# Patient Record
Sex: Male | Born: 1935 | Race: White | Hispanic: No | Marital: Married | Smoking: Never smoker
Health system: Southern US, Community
[De-identification: ages and names within clinical notes are randomized; demographics above are authoritative.]

## PROBLEM LIST (undated history)

## (undated) DIAGNOSIS — I714 Abdominal aortic aneurysm, without rupture, unspecified: Secondary | ICD-10-CM

## (undated) DIAGNOSIS — E785 Hyperlipidemia, unspecified: Secondary | ICD-10-CM

## (undated) DIAGNOSIS — I10 Essential (primary) hypertension: Secondary | ICD-10-CM

## (undated) HISTORY — PX: CARDIAC SURGERY: SHX584

## (undated) HISTORY — DX: Abdominal aortic aneurysm, without rupture: I71.4

## (undated) HISTORY — PX: CORONARY ARTERY BYPASS GRAFT: SHX141

## (undated) HISTORY — DX: Abdominal aortic aneurysm, without rupture, unspecified: I71.40

## (undated) HISTORY — DX: Hyperlipidemia, unspecified: E78.5

## (undated) HISTORY — PX: COLONOSCOPY: SHX174

## (undated) HISTORY — PX: HERNIA REPAIR: SHX51

## (undated) HISTORY — PX: CATARACT EXTRACTION, BILATERAL: SHX1313

## (undated) HISTORY — PX: CHOLECYSTECTOMY: SHX55

---

## 2006-04-06 ENCOUNTER — Ambulatory Visit: Payer: Self-pay | Admitting: Internal Medicine

## 2006-08-20 ENCOUNTER — Ambulatory Visit: Payer: Self-pay | Admitting: Internal Medicine

## 2006-09-26 ENCOUNTER — Ambulatory Visit: Payer: Self-pay | Admitting: Gastroenterology

## 2007-09-05 ENCOUNTER — Ambulatory Visit: Payer: Self-pay | Admitting: Internal Medicine

## 2007-09-11 ENCOUNTER — Ambulatory Visit: Payer: Self-pay | Admitting: Internal Medicine

## 2007-09-12 ENCOUNTER — Encounter: Payer: Self-pay | Admitting: Thoracic Surgery (Cardiothoracic Vascular Surgery)

## 2007-09-12 ENCOUNTER — Inpatient Hospital Stay (HOSPITAL_COMMUNITY)
Admission: AD | Admit: 2007-09-12 | Discharge: 2007-09-19 | Payer: Self-pay | Admitting: Thoracic Surgery (Cardiothoracic Vascular Surgery)

## 2007-09-12 ENCOUNTER — Ambulatory Visit: Payer: Self-pay | Admitting: Thoracic Surgery (Cardiothoracic Vascular Surgery)

## 2007-09-25 ENCOUNTER — Ambulatory Visit: Payer: Self-pay | Admitting: Thoracic Surgery (Cardiothoracic Vascular Surgery)

## 2007-10-10 ENCOUNTER — Ambulatory Visit: Payer: Self-pay | Admitting: Thoracic Surgery (Cardiothoracic Vascular Surgery)

## 2007-10-10 ENCOUNTER — Encounter
Admission: RE | Admit: 2007-10-10 | Discharge: 2007-10-10 | Payer: Self-pay | Admitting: Thoracic Surgery (Cardiothoracic Vascular Surgery)

## 2007-12-04 ENCOUNTER — Encounter: Payer: Self-pay | Admitting: Internal Medicine

## 2007-12-31 ENCOUNTER — Encounter: Payer: Self-pay | Admitting: Internal Medicine

## 2008-01-30 ENCOUNTER — Encounter: Payer: Self-pay | Admitting: Internal Medicine

## 2008-03-01 ENCOUNTER — Encounter: Payer: Self-pay | Admitting: Internal Medicine

## 2008-03-11 ENCOUNTER — Ambulatory Visit: Payer: Self-pay | Admitting: Internal Medicine

## 2010-09-13 NOTE — Assessment & Plan Note (Signed)
OFFICE VISIT   Derrick Jackson, Derrick Jackson  DOB:  [birth year]                                        October 10, 2007  CHART #:  [id number]   This patient is a 77 year old gentleman who underwent coronary bypass  grafting x5, on Sep 13, 2007, for left main and three-vessel disease.  Postoperatively, he had some atrial fibrillation.  He was started on  amiodarone.  He did not require anticoagulation.  He was discharged home  on Sep 19, 2007.  Since that time, he has continued to do well.  He is  not having to take any pain medications.  He has not had any anginal-  type symptoms.  He has been walking on a regular basis and doing well  with that.  Overall, he is very pleased with his progress.  He does  complain of some swelling around the left knee incision.   PHYSICAL EXAMINATION:  This patient is a 77 year old white male in no  acute distress.  Blood pressure 164/87, pulse rate 48, respirations are 20, and his  oxygen saturation 97% on room air.  LUNGS:  Clear with equal breath sounds bilaterally.  CARDIAC:  Regular rate and rhythm.  Normal S1 and S2.  No rubs, murmurs,  or gallops.  Sternum is stable.  Sternal incision is clean, dry, and  intact.  Chest tube sites are healing well.  Leg incisions have healed.  He does have a seroma associated with the left incision in the medial  aspect of the left knee.  There is no erythema or tenderness.   Chest x-ray shows good aeration with improvement of his left basilar  atelectasis.   IMPRESSION:  This patient is doing very well at this point in time.  He  is now about a month out from surgery.  He may begin driving a car.  Appropriate precautions were discussed.  He is not to lift any objects  weighing greater than 10 pounds for another 2 weeks and then will build  up gradually from there.  Regarding the seroma, I told him just to  observe that.  If it becomes painful, red, or irritated, he should  contact us immediately  as it could indicate an infection, but otherwise  there is not a problem unless it bothers him.  He will continue to be  followed by Dr.  Gwen Pounds in [place] as well as Dr. Judithann Sheen.  I will be happy to see  him back anytime, if I could be of any further assistance with his care.   Salvatore Decent Dorris Fetch, M.Jackson.  Electronically Signed   SCH/MEDQ  Jackson:  10/10/2007  T:  10/10/2007  Job:  [id number]   cc:   Aram Beecham, MD  Smitty Cords MD Gwen Pounds

## 2010-09-13 NOTE — Discharge Summary (Signed)
NAMECLAYBORNE, DIVIS NO.:  [id number]   MEDICAL RECORD NO.:  [id number]          PATIENT TYPE:  INP   LOCATION:  2017                         FACILITY:  [facility]   PHYSICIAN:  Salvatore Decent. Dorris Fetch, M.D.DATE OF BIRTH:  Apr 25, 1932   DATE OF ADMISSION:  09/12/2007  DATE OF DISCHARGE:  09/19/2007                               DISCHARGE SUMMARY   HISTORY OF PRESENT ILLNESS:  The patient is a 77 year old white male  with a recent history of exertional shortness of breath and chest pain.  He underwent full evaluation including cardiac catheterization which  revealed multivessel coronary artery disease including 90% left main and  distal 80% posterior descending coronary lesion.  He was transferred to  [facility] for surgical revascularization.   PAST MEDICAL HISTORY:  Hyperlipidemia.   FAMILY HISTORY:  1. Coronary artery disease.  2. History of cholecystectomy.  3. History of ventral hernia.  4. History of right knee arthroscopy.   MEDICATIONS:  Prior to admission include aspirin 81 mg daily.   ALLERGIES:  PENICILLIN which causes hives.  He also has an allergy to  TETANUS, unknown reaction.   FAMILY HISTORY/SOCIAL HISTORY/REVIEW OF SYSTEMS, AND PHYSICAL EXAM:  Please see the history and physical.   PRIOR ADMISSION HOSPITAL COURSE:  The patient was admitted and  transferred from [facility].  He was seen and then  evaluated by Dr. Dorris Fetch.  He agreed with recommendations to proceed  with the surgical revascularization.   PROCEDURES:  On Sep 13, 2007, he underwent the following procedure,  coronary artery bypass grafting x5.  The following grafts were placed,  1. Left internal mammary artery to the LAD.  2. Saphenous vein graft to diagonal.  3. Saphenous vein graft to obtuse marginal 1.  4. Sequential saphenous vein graft to posterior descending and      posterolateral coronary arteries.   The patient tolerated the procedure well.  He  was taken to the Surgical  Intensive Care Unit in stable condition.   POSTOPERATIVE HOSPITAL COURSE:  The patient has done quite well.  He has  remained hemodynamically stable, although initially did have relatively  slow heart rate.  He subsequently had an episode of atrial fibrillation  with a rapid ventricular response and has been chemically cardioverted.  He is in normal sinus rhythm with amiodarone and beta-blockade.  He was  weaned from the ventilator without difficulty.  He does have a moderate  postoperative anemia.  This has stabilized.  His most recent hemoglobin  and hematocrit dated Sep 15, 2007, at 10 and 29 respectively.  He has  moderate postoperative volume overload which is responding well to  diuretics.  Incisions all are healing well without signs of infection.  Oxygen has been weaned and he maintained good saturations on room air.  He is tolerating routine cardiac rehabilitation using standard  protocols.  All routine lines, monitors, drains, and devices were  discontinued in the standard fashion.  BUN and creatinine dated Sep 20, 2007 is 11 and 0.89 respectively.  Overall, the patient's status was  felt to be tentatively stable for discharge in  the morning of Sep 19, 2007, pending morning round reevaluation.   MEDICATIONS:  At the time of discharge include the following,  1. Tylox 1-2 every 4-6 hours as needed for pain.  2. Lipitor 40 mg daily.  3. Aspirin 325 mg daily.  4. Amiodarone 400 mg twice daily for 10 days, then 400 mg daily.  5. Lasix 40 mg daily.  6. Potassium 20 mEq daily for 7 days.  7. Lopressor 25 mg twice daily.   INSTRUCTIONS:  The patient received written instructions in regard to  medications, activities, diet, wound care, and followup.  Follow up  include Dr. Dorris Fetch on October 10, 2007.  Additionally, staples will be  removed on Sep 25, 2007.  The patient was also instructed to follow up  with Dr. Gwen Pounds in 2 weeks.   CONDITION ON  DISCHARGE:  Stable and improved.   FINAL DIAGNOSES:  Severe coronary artery disease including 90% left  main.  Also, significant disease in the right coronary artery.  Now  status post surgical revascularization as described above.   OTHER DIAGNOSES:  1. Postoperative anemia.  2. Acute blood loss, stable.  3. Postoperative atrial fibrillation, now in normal sinus rhythm,      stable.  4. Hyperlipidemia.   Previous surgeries as listed above.      Rowe Clack, P.A.-C.      Salvatore Decent Dorris Fetch, M.D.  Electronically Signed    WEG/MEDQ  D:  09/18/2007  T:  09/19/2007  Job:  [id number]   cc:   Lamar Blinks, MD

## 2010-09-13 NOTE — Discharge Summary (Signed)
NAMEEISSA, BUCHBERGER NO.:  [id number]   MEDICAL RECORD NO.:  [id number]          PATIENT TYPE:  INP   LOCATION:  2001                         FACILITY:  [facility]   PHYSICIAN:  Salvatore Decent. Dorris Fetch, M.D.DATE OF BIRTH:  November 30, 1931   DATE OF ADMISSION:  09/12/2007  DATE OF DISCHARGE:                               DISCHARGE SUMMARY   REASON FOR TRANSFER:  Multivessel coronary artery disease (90% left main  included from [facility]).   HISTORY OF PRESENT ILLNESS:  This is a pleasant 77 year old Caucasian  male who states that he was in his normal state of health until  approximately 1 week ago when he went to see his primary care physician  Dr. Judithann Sheen for a checkup at that time.  He revealed he was having some  shortness of breath upon exertion as well as chest discomfort that  radiated down into the left arm.  An EKG was done, and the patient was  also told he has high cholesterol.  He was then sent to see Dr. Georgina Quint, a  cardiologist, that same day.  Because of personal matters, the patient  had to postpone his cardiac catheterization until Wednesday Sep 11, 2007.  His cardiac catheterization was performed was at [facility] which revealed 90% left main, 30% proximal right  coronary artery, 80% distal PDA and posterior lateral artery.  The  patient was then transferred to [facility].  It should be noted  that the patient denied any nauseousness, emesis, diaphoresis, syncope  or presyncopal episodes, palpitations, orthopnea, or lower extremity  edema.  His last episode of chest pain was last evening while in bed.  Currently, the patient denies any chest pain or shortness of breath.   PAST MEDICAL HISTORY:  Significant for newly diagnosed hyperlipidemia.  He denies hypertension, MI, TIA, or stroke.  He denies DVT or PE.  He  denies diabetes.  He denies any liver, lung, or kidney disease.  He  denies any peptic ulcer  disease.   PAST SURGICAL HISTORY:  Significant for  1. Ventral hernia approximately 30 years ago.  2. Right knee arthroscopy.  3. Laparoscopic cholecystectomy approximately 5 years ago.   ALLERGIES:  PENICILLIN which causes hives and TETANUS.   MEDICATIONS:  ECASA 81 mg p.o. daily.   SOCIAL HISTORY:  He denies tobacco or pipe use.  Alcohol use is  described as 1 beer daily.   FAMILY HISTORY:  Mother and father are both deceased.  Mother aged 75  had an MI.  Father aged 75 also had an MI.   REVIEW OF SYMPTOMS:  Currently, the patient denies any shortness of  breath or chest pain.  He denies any fever, chills, or night sweats.  He  denies any blurry or double vision.  He denies any abdominal pain,  nausea, vomiting, melena, or hematochezia.  Remaining review of symptoms  is noncontributory except as previously stated.   PHYSICAL EXAMINATION:  GENERAL:  This is a pleasant 77 year old  Caucasian male.  His wife is at his bedside.  He is alert, oriented,  cooperative, and  in no acute distress.  VITAL SIGNS:  The latest vital signs revealed the following:  The  patient is afebrile.  Heart rate is 62, respiratory rate 20, O2  saturation 92% on room air, BP 133/75.  HEENT:  Head is atraumatic and normocephalic.  Extraocular movements are  intact.  Pupils are equal, round, and reactive to light and  accommodation.  Sclerae anicteric.  NECK:  Supple.  No JVD.  No lymphadenopathy. Carotids are +2  bilaterally.  No bruits appreciated.  LUNGS:  Grossly clear to auscultation bilaterally.  No rales, wheezes,  or rhonchi.  CARDIOVASCULAR:  Bradycardic heart rate 50s to low 60s on tele.  No  murmurs, gallops, or rubs.  ABDOMEN:  Soft, protuberant, nontender.  Bowel sounds are present  throughout.  No CVA tenderness.  No rebound, no guarding.  EXTREMITIES:  No cyanosis, clubbing, or edema.  Palpable dorsalis pedis  and posterior tibialis 2+ bilaterally.  NEUROLOGIC:  Cranial nerves II through  XII are grossly intact without  any focal deficits.   IMPRESSION:  1. Multivessel coronary artery disease (90% left main disease      included).  2. Newly diagnosed hyperlipidemia.   PLAN:  The patient will undergo carotid duplex ultrasound and other  laboratory studies will be scheduled for coronary artery bypass grafting  surgery with Dr. Dorris Fetch on Sep 13, 2007.      Doree Fudge, PA      Viviann Spare C. Dorris Fetch, M.D.  Electronically Signed    DZ/MEDQ  D:  09/12/2007  T:  09/13/2007  Job:  [id number]

## 2010-09-13 NOTE — Op Note (Signed)
NAMESRIHARI, SHELLHAMMER NO.:  [id number]   MEDICAL RECORD NO.:  [id number]          PATIENT TYPE:  INP   LOCATION:  2307                         FACILITY:  [facility]   PHYSICIAN:  Salvatore Decent. Dorris Fetch, M.D.DATE OF BIRTH:  03/09/1932   DATE OF PROCEDURE:  09/13/2007  DATE OF DISCHARGE:                               OPERATIVE REPORT   PREOPERATIVE DIAGNOSIS:  Left main and three-vessel coronary artery  disease.   POSTOPERATIVE DIAGNOSIS:  Left main and three-vessel coronary artery  disease.   PROCEDURE:  Median sternotomy, extracorporeal circulation, coronary  artery bypass graft x5 (left internal mammary artery to left anterior  descending, saphenous vein graft to first diagonal, saphenous vein graft  to obtuse marginal one, sequential saphenous vein graft to posterior  descending and posterior lateral), endoscopic vein harvest, left leg,  open vein harvest, right thigh.   SURGEON:  Salvatore Decent. Dorris Fetch, MD   ASSISTANT:  Sheliah Plane, MD   SECOND ASSISTANT:  Stephanie Acre. Calton, PA   ANESTHESIA:  General.   FINDINGS:  OM-2 too small to graft, posterior descending and diagonal  fair quality, remaining targets good quality.  The vein bifurcated at  the level of the knee to the upper thigh bilaterally, saphenous vein  that was utilized was of a fair quality, a central mammary artery good  quality.   CLINICAL NOTE:  Derrick Jackson is a 77 year old gentleman who for the past  year has been having exertional chest pain and recently that had  worsened.  He saw his doctor, Dr. Judithann Sheen, at [facility] for a checkup  and discussed this chest discomfort and shortness of breath on exertion.  He was diagnosed with hyperlipidemia and referred for a cardiology  evaluation.  He underwent cardiac catheterization at [facility]  by Dr. Arnoldo Hooker, which revealed a 95% left main stenosis as well  as significant disease in the right coronary and midportion of the  posterior descending.  He was transferred to [facility], he was  advised to undergo coronary artery bypass grafting.  The indications,  risks, benefits, and alternatives were discussed in detail with the  patient.  He understood and accepted the risks and agreed to proceed.   OPERATIVE NOTE:  [name] was brought to the preop holding area on  Sep 13, 2007.  There lines were placed by anesthesia for monitoring  central venous and pulmonary arterial pressures.  Intravenous  antibiotics were administered.  The patient was taken to the operating  room, anesthetized, and intubated.  A Foley catheter was placed.  The  chest, abdomen, and legs were prepped and draped in the usual fashion.  A median sternotomy was performed.  The left internal mammary artery was  harvested using standard technique.  Simultaneously incision was made in  the medial aspect of the right leg, a small vein was noted at the level  of the knee, and an incision was made in the medial aspect of the left  leg, and the vein there was slightly larger, but still relatively small.  The vein was harvested from the left leg endoscopically.  A 3000 units  of heparin was administered during the vessel harvest.  During the  course of the endoscopic dissection, it was noted that there was  bifurcated system and both systems were dissected out.  A counter  incision was made in the mid thigh to allow retrieval of the vein, and  there was a significant portion of the vein that was not usable.  Therefore, additional vein was harvested from the right thigh using a  bridged open technique.  There was also was a large vein in the groin  which the upper thigh bifurcated and remained bifurcated throughout the  extent of the dissection, which was carried down approximately two-  thirds of the length of the thigh.  The vein was then harvested and  prepared using standard open technique.   The remainder of the full heparin dose was  given, the pericardium was  opened, and the ascending aorta was inspected.  There was no evidence of  atherosclerotic disease.  The aorta was cannulated via concentric 2-0  Ethibond pledgeted pursestring sutures.  A dual-stage venous cannula was  placed via pursestring suture in the right atrial appendage.  Cardiopulmonary bypass instituted and the patient was cooled to 32  degrees Celsius.  The coronary arteries were inspected and the  anastomotic sites were chosen, and the conduits were inspected and cut  to length, a foam pad was placed in the pericardium to protect the  opening nerve.  A temperature probe was placed in the myocardial septum  and a cardioplegic cannula placed in the ascending aorta.   The aorta was crossclamped, the left ventricle was emptied via aortic  root vent.  Cardiac arrest was achieved with a combination of cold  antegrade blood cardioplegia and topical iced saline.  The initial  cooling was relatively slow, 1500 mL of cardioplegia was administered  and the myocardial septal temperature cooled to 10 degrees Celsius.  The  following distal anastomoses were performed.   First, a reversed saphenous vein graft was placed end-to-side to first  diagonal branch of the LAD.  This was a 1.5-mm fair quality target.  The  vein graft was relatively of small caliber, but acceptable quality.  The  vein was anastomosed end-to-side with a running 7-0 Prolene suture.  All  anastomoses were probed proximally and distally at their completion, and  also at the completion of each vein graft, cardioplegia was administered  to assess for an hemostasis.   Next, a reverse saphenous vein graft was placed end-to-side to the first  obtuse marginal branch of the left circumflex coronary artery.  The vein  was considerably larger and of better quality.  The vein was anastomosed  end-to-side with a running 7-0 Prolene suture.   Next, a reversed saphenous vein graft was placed  sequentially to the  midportion of the posterior descending and into the posterior lateral  branches of the right coronary.  The midportion of the posterior  descending was grafted because there was plaque in the proximal and  midportions, the graft was placed distal to all palpable plaque vessel.  There was a 1.5-mm vessel with fair quality.  The posterior lateral was  a 1.5-mm good-quality vessel.  A side-to-side anastomosis was performed  to the posterior descending in an end-to-side to the posterior lateral,  both were performed with the running 7-0 Prolene sutures.   Next, the left internal mammary artery was brought through a window in  the pericardium.  The distal end was beveled and was anastomosed end-to-  side to the distal LAD.  The LAD was a 1.5-mm vessel at the site of the  anastomosis, but did taper to a 1-mm vessel more distally.  It was free  of disease at the site of the anastomosis and beyond.  The mammary was a  2-mm good-quality conduit, it was anastomosed end-to-side with a running  8-0 Prolene suture.  At the completion of the mammary-to-LAD  anastomosis, the bulldog clamp was briefly removed to inspect for  hemostasis.  Immediate rapid septal rewarming was noted.  The bulldog  clamp was replaced.  The mammary pedicle was tacked at the epicardial  surface of the heart with 6-0 Prolene sutures.   Additional cardioplegia was administered and the vein grafts were cut to  length.  The proximal vein graft anastomoses were performed to 4.5-mm  punch aortotomies with running 6-0 Prolene sutures.  At the completion  of the final proximal anastomoses, the patient was placed in the  Trendelenburg position, and lidocaine was administered.  The aortic root  was de-aired and aortic crossclamp was removed.  The total crossclamp  time was 68 minutes.   As the patient was being rewarmed, all proximal and distal anastomoses  were inspected for hemostasis.  Epicardial pacing wires  were placed on  the right ventricle and right atrium, and the patient was rewarmed to a  core temperature of 37 degrees Celsius.  He was weaned from  cardiopulmonary bypass on the first attempt without difficulty.  He was  atrially paced 80 beats per minute as he was in sinus bradycardia  underneath.  He was on no ionotropic support.  The initial cardiac index  was greater than 2 L/min/m2, and the patient remained hemodynamically  stable throughout the post bypass period.   A test dose of protamine was administered and it was well tolerated.  The atrial and aortic cannulae were removed.  The remainder of the  protamine was administered without incident.  The chest was irrigated  with 1 L of warm normal saline containing 1 g of vancomycin.  Hemostasis  was achieved.  Two mediastinal chest tubes were placed through two  separate subcostal incisions.  The pericardium was reapproximated over  the ascending aorta and base of the heart with interrupted 3-0 silk  sutures.  The sternum was closed with a combination of single and double  heavy-gauge stainless steel wires, it came together easily without any  hemodynamic compromise or change in hemodynamics.  The pectoralis  fascia, subcutaneous tissue,  and skin were closed in standard fashion.  All sponge, needle, and sponge counts were correct at the end of  procedure.  There were no intraoperative complications.  The patient was  taken from the operating room to the surgical intensive care unit in  fair condition.      Salvatore Decent Dorris Fetch, M.D.  Electronically Signed     SCH/MEDQ  D:  09/13/2007  T:  09/14/2007  Job:  [id number]   cc:   Smitty Cords MD Tora Kindred, MD

## 2011-01-25 LAB — CBC
HCT: 27.1 — ABNORMAL LOW
HCT: 29.6 — ABNORMAL LOW
Hemoglobin: 10.1 — ABNORMAL LOW
Hemoglobin: 10.1 — ABNORMAL LOW
MCHC: 34.9
MCHC: 35
MCV: 91.3
MCV: 91.9
MCV: 92
Platelets: 158
Platelets: 91 — ABNORMAL LOW
Platelets: 98 — ABNORMAL LOW
RBC: 2.92 — ABNORMAL LOW
RBC: 3.17 — ABNORMAL LOW
RBC: 3.19 — ABNORMAL LOW
RBC: 3.37 — ABNORMAL LOW
RBC: 3.43 — ABNORMAL LOW
RDW: 13.4
RDW: 13.9
WBC: 5.4
WBC: 5.8
WBC: 6.6
WBC: 6.9

## 2011-01-25 LAB — BASIC METABOLIC PANEL
BUN: 11
BUN: 8
BUN: 8
CO2: 26
CO2: 27
CO2: 27
Calcium: 7.8 — ABNORMAL LOW
Chloride: 104
Chloride: 106
Chloride: 106
Chloride: 106
Creatinine, Ser: 0.85
Creatinine, Ser: 0.95
GFR calc Af Amer: >60
GFR calc Af Amer: >60
GFR calc non Af Amer: >60
GFR calc non Af Amer: >60
GFR calc non Af Amer: >60
Glucose, Bld: 103 — ABNORMAL HIGH
Glucose, Bld: 110 — ABNORMAL HIGH
Glucose, Bld: 127 — ABNORMAL HIGH
Potassium: 3.5
Potassium: 3.6
Potassium: 3.9
Potassium: 3.9
Sodium: 139
Sodium: 140

## 2011-01-25 LAB — POCT I-STAT, CHEM 8
Chloride: 103
Glucose, Bld: 159 — ABNORMAL HIGH
HCT: 30 — ABNORMAL LOW
Hemoglobin: 10.2 — ABNORMAL LOW
Potassium: 4.6

## 2011-01-25 LAB — POCT I-STAT 4, (NA,K, GLUC, HGB,HCT)
Glucose, Bld: 106 — ABNORMAL HIGH
Glucose, Bld: 117 — ABNORMAL HIGH
Glucose, Bld: 133 — ABNORMAL HIGH
HCT: 24 — ABNORMAL LOW
HCT: 26 — ABNORMAL LOW
HCT: 31 — ABNORMAL LOW
Hemoglobin: 10.5 — ABNORMAL LOW
Hemoglobin: 7.8 — CL
Hemoglobin: 8.8 — ABNORMAL LOW
Operator id: 3402
Operator id: 3402
Operator id: 3402
Potassium: 3.5
Potassium: 4.4
Sodium: 139
Sodium: 139
Sodium: 140
Sodium: 141

## 2011-01-25 LAB — CREATININE, SERUM
Creatinine, Ser: 0.8
Creatinine, Ser: 0.93
GFR calc non Af Amer: >60
GFR calc non Af Amer: >60

## 2011-01-25 LAB — POCT I-STAT GLUCOSE
Glucose, Bld: 120 — ABNORMAL HIGH
Operator id: 125961
Operator id: 125961

## 2011-01-25 LAB — POCT I-STAT 3, ART BLOOD GAS (G3+)
Acid-base deficit: 2
Acid-base deficit: 2
O2 Saturation: 100
O2 Saturation: 93
O2 Saturation: 97
Operator id: 274841
Patient temperature: 35.9
TCO2: 24
TCO2: 24
pCO2 arterial: 37.4
pCO2 arterial: 38.7
pH, Arterial: 7.382
pO2, Arterial: 312 — ABNORMAL HIGH

## 2011-01-25 LAB — MAGNESIUM
Magnesium: 2.4
Magnesium: 2.6 — ABNORMAL HIGH

## 2011-01-25 LAB — APTT: aPTT: 35

## 2011-01-25 LAB — PROTIME-INR: Prothrombin Time: 17.5 — ABNORMAL HIGH

## 2011-01-25 LAB — HEMOGLOBIN AND HEMATOCRIT, BLOOD: HCT: 23.6 — ABNORMAL LOW

## 2011-01-25 LAB — PLATELET COUNT: Platelets: 102 — ABNORMAL LOW

## 2012-04-15 ENCOUNTER — Ambulatory Visit: Payer: Self-pay | Admitting: Ophthalmology

## 2013-06-16 ENCOUNTER — Ambulatory Visit: Payer: Self-pay | Admitting: Ophthalmology

## 2014-08-18 NOTE — Op Note (Signed)
PATIENT NAME:  Derrick Jackson, Derrick Jackson MR#:  [id number] DATE OF BIRTH:  09/20/1931  DATE OF PROCEDURE:  04/15/2012  PREOPERATIVE DIAGNOSIS:  Cataract, left eye.   POSTOPERATIVE DIAGNOSIS: Cataract, left eye.   PROCEDURE PERFORMED:  Extracapsular cataract extraction using phacoemulsification with placement of an Alcon [id number], 19.5 diopter posterior chamber lens, serial number [id number].   SURGEON:  Loura Back. Megan Hayduk, MD  ANESTHESIA:  Lidocaine 4% and 0.75% Marcaine in a 50:50 mixture with 10 units per mL of Hylenex added, given as peri-bulbar.   ANESTHESIOLOGIST:  Alvin Critchley, MD  COMPLICATIONS:  None.   ESTIMATED BLOOD LOSS:  Less than 1 mL.   DESCRIPTION OF PROCEDURE:  The patient was brought to the operating room and given a peribulbar block.  The patient was then prepped and draped in the usual fashion.  The vertical rectus muscles were imbricated using 5-0 silk sutures.  These sutures were then clamped to the sterile drapes as bridle sutures.  A limbal peritomy was performed extending two clock hours and hemostasis was obtained with cautery.  A partial thickness scleral groove was made at the surgical limbus and dissected anteriorly in a lamellar dissection using an Alcon crescent knife.  The anterior chamber was entered supero-temporally with a Superblade and through the lamellar dissection with a 2.6 mm keratome.  DisCoVisc was used to replace the aqueous and a continuous tear capsulorrhexis was carried out.  Hydrodissection and hydrodelineation were carried out with balanced salt and a 27 gauge canula.  The nucleus was rotated to confirm the effectiveness of the hydrodissection.  Phacoemulsification was carried out using a divide-and-conquer technique.  Total ultrasound time was 1 minutes and 8 seconds with an average power of 18.9 percent.  Irrigation/aspiration was used to remove the residual cortex.  DisCoVisc was used to inflate the capsule and the internal incision was enlarged  to 3 mm with the crescent knife.  The intraocular lens was folded and inserted into the capsular bag using the [employer].  Irrigation/aspiration was used to remove the residual DisCoVisc.  Miostat was injected into the anterior chamber through the paracentesis track to inflate the anterior chamber and induce miosis.  The wound was checked for leaks and wound leakage was found.  A single 10-0 suture was placed across the incision, tied and the knot was rotated superiorly.  The conjunctiva was closed with cautery and the bridle sutures were removed.  Two drops of 0.3% Vigamox were placed on the eye.   An eye shield was placed on the eye.  The patient was discharged to the recovery room in good condition.   DESCRIPTION OF PROCEDURE: Ultrasound time was one minute and 8 seconds with an average power of 18.9%. CDE 24.71. A suture was placed. The AcrySert delivery system was used instead of a Librarian, academic.     ____________________________ Loura Back. Danni Shima, MD sad:es D: 04/15/2012 13:19:50 ET T: 04/16/2012 11:41:48 ET JOB#: [id number]  cc: Remo Lipps A. Libertie Hausler, MD, <Dictator> Martie Lee MD ELECTRONICALLY SIGNED 04/22/2012 12:48

## 2014-08-22 NOTE — Op Note (Signed)
PATIENT NAME:  Derrick Jackson, Derrick Jackson MR#:  [id number] DATE OF BIRTH:  November 05, 1931  DATE OF PROCEDURE:  06/16/2013  PREOPERATIVE DIAGNOSIS: Cataract, right eye.   POSTOPERATIVE DIAGNOSIS: Cataract, right eye.  PROCEDURE PERFORMED: Extracapsular cataract extraction using phacoemulsification with placement of Alcon [id number], 18.0-diopter posterior chamber lens, serial number [id number].   SURGEON: Loura Back. Acelin Ferdig, M.D.   ANESTHESIA: 4% lidocaine and 0.75% Marcaine a 50-50 mixture with 10 units/mL of HyoMax added, given as a peribulbar.   ANESTHESIOLOGIST: Dr. Kayleen Memos.   COMPLICATIONS: None.   ESTIMATED BLOOD LOSS: Less than 1 mL.   DESCRIPTION OF PROCEDURE:  The patient was brought to the operating room and given a peribulbar block.  The patient was then prepped and draped in the usual fashion.  The vertical rectus muscles were imbricated using 5-0 silk sutures.  These sutures were then clamped to the sterile drapes as bridle sutures.  A limbal peritomy was performed extending two clock hours and hemostasis was obtained with cautery.  A partial thickness scleral groove was made at the surgical limbus and dissected anteriorly in a lamellar dissection using an Alcon crescent knife.  The anterior chamber was entered superonasally with a Superblade and through the lamellar dissection with a 2.6 mm keratome.  DisCoVisc was used to replace the aqueous and a continuous tear capsulorrhexis was carried out.  Hydrodissection and hydrodelineation were carried out with balanced salt and a 27 gauge canula.  The nucleus was rotated to confirm the effectiveness of the hydrodissection.  Phacoemulsification was carried out using a divide-and-conquer technique.  Total ultrasound time was 1 minute and 5 seconds with an average power of 26.0%. CDE 26.75.  Irrigation/aspiration was used to remove the residual cortex.  DisCoVisc was used to inflate the capsule and the internal incision was enlarged to 3 mm with the  crescent knife.  The intraocular lens was folded and inserted into the capsular bag using the AcrySert delivery system.  Irrigation/aspiration was used to remove the residual DisCoVisc.  Miostat was injected into the anterior chamber through the paracentesis track to inflate the anterior chamber and induce miosis.  A tenth of a mL of Vigamox containing a tenth of a mg was injected via the paracentesis track. The wound was checked for leaks and none were found. The conjunctiva was closed with cautery and the bridle sutures were removed.  Two drops of 0.3% Vigamox were placed on the eye.   An eye shield was placed on the eye.  The patient was discharged to the recovery room in good condition.  ____________________________ Loura Back Olivia Royse, MD sad:aw D: 06/16/2013 13:21:40 ET T: 06/16/2013 13:53:40 ET JOB#: [id number]  cc: Remo Lipps A. Jerrit Horen, MD, <Dictator> Martie Lee MD ELECTRONICALLY SIGNED 07/07/2013 13:46

## 2014-11-19 DIAGNOSIS — I10 Essential (primary) hypertension: Secondary | ICD-10-CM | POA: Insufficient documentation

## 2015-11-26 ENCOUNTER — Emergency Department: Payer: Medicare Other

## 2015-11-26 ENCOUNTER — Emergency Department
Admission: EM | Admit: 2015-11-26 | Discharge: 2015-11-26 | Disposition: A | Discharge status: Home / Self Care | Payer: Medicare Other | Attending: Emergency Medicine | Admitting: Emergency Medicine

## 2015-11-26 DIAGNOSIS — R1032 Left lower quadrant pain: Secondary | ICD-10-CM | POA: Diagnosis present

## 2015-11-26 DIAGNOSIS — N23 Unspecified renal colic: Secondary | ICD-10-CM | POA: Diagnosis not present

## 2015-11-26 LAB — URINALYSIS COMPLETE WITH MICROSCOPIC (ARMC ONLY)
Bacteria, UA: NONE SEEN
Bilirubin Urine: NEGATIVE
Glucose, UA: NEGATIVE mg/dL
Leukocytes, UA: NEGATIVE
Nitrite: NEGATIVE
PH: 5 (ref 5.0–8.0)
PROTEIN: 30 mg/dL — AB
Specific Gravity, Urine: 1.023 (ref 1.005–1.030)

## 2015-11-26 LAB — CBC
HCT: 42.6 % (ref 40.0–52.0)
HEMOGLOBIN: 15.2 g/dL (ref 13.0–18.0)
MCH: 32.7 pg (ref 26.0–34.0)
MCHC: 35.6 g/dL (ref 32.0–36.0)
MCV: 91.9 fL (ref 80.0–100.0)
Platelets: 151 10*3/uL (ref 150–440)
RBC: 4.63 MIL/uL (ref 4.40–5.90)
RDW: 14.4 % (ref 11.5–14.5)
WBC: 9.2 10*3/uL (ref 3.8–10.6)

## 2015-11-26 LAB — COMPREHENSIVE METABOLIC PANEL
ALBUMIN: 4.3 g/dL (ref 3.5–5.0)
ALK PHOS: 57 U/L (ref 38–126)
ALT: 19 U/L (ref 17–63)
ANION GAP: 7 (ref 5–15)
AST: 22 U/L (ref 15–41)
BUN: 13 mg/dL (ref 6–20)
CALCIUM: 9.2 mg/dL (ref 8.9–10.3)
CHLORIDE: 107 mmol/L (ref 101–111)
CO2: 25 mmol/L (ref 22–32)
CREATININE: 0.97 mg/dL (ref 0.61–1.24)
GFR calc non Af Amer: >60 mL/min (ref >60)
GLUCOSE: 149 mg/dL — AB (ref 65–99)
Potassium: 3.7 mmol/L (ref 3.5–5.1)
SODIUM: 139 mmol/L (ref 135–145)
Total Bilirubin: 0.9 mg/dL (ref 0.3–1.2)
Total Protein: 7.5 g/dL (ref 6.5–8.1)

## 2015-11-26 LAB — LIPASE, BLOOD: LIPASE: 20 U/L (ref 11–51)

## 2015-11-26 LAB — TROPONIN I

## 2015-11-26 MED ORDER — MORPHINE SULFATE (PF) 4 MG/ML IV SOLN
INTRAVENOUS | Status: AC
  Filled 2015-11-26: qty 1

## 2015-11-26 MED ORDER — TAMSULOSIN HCL 0.4 MG PO CAPS: 0.4000 mg | ORAL_CAPSULE | Freq: Every day | ORAL | 0 refills | Status: DC

## 2015-11-26 MED ORDER — SODIUM CHLORIDE 0.9 % IV SOLN
Freq: Once | INTRAVENOUS | Status: AC
  Administered 2015-11-26: 15:00:00 via INTRAVENOUS

## 2015-11-26 MED ORDER — ONDANSETRON HCL 4 MG PO TABS: 4.0000 mg | ORAL_TABLET | Freq: Every day | ORAL | 1 refills | Status: DC | PRN

## 2015-11-26 MED ORDER — DIATRIZOATE MEGLUMINE & SODIUM 66-10 % PO SOLN
15.0000 mL | Freq: Once | ORAL | Status: AC
  Administered 2015-11-26: 15 mL via ORAL

## 2015-11-26 MED ORDER — KETOROLAC TROMETHAMINE 30 MG/ML IJ SOLN
30.0000 mg | Freq: Once | INTRAMUSCULAR | Status: AC
  Administered 2015-11-26: 30 mg via INTRAVENOUS

## 2015-11-26 MED ORDER — MORPHINE SULFATE (PF) 4 MG/ML IV SOLN
4.0000 mg | Freq: Once | INTRAVENOUS | Status: AC
  Administered 2015-11-26: 4 mg via INTRAVENOUS

## 2015-11-26 MED ORDER — MORPHINE SULFATE (PF) 4 MG/ML IV SOLN
4.0000 mg | Freq: Once | INTRAVENOUS | Status: AC
  Administered 2015-11-26: 4 mg via INTRAVENOUS
  Filled 2015-11-26: qty 1

## 2015-11-26 MED ORDER — KETOROLAC TROMETHAMINE 30 MG/ML IJ SOLN
INTRAMUSCULAR | Status: AC
  Filled 2015-11-26: qty 1

## 2015-11-26 MED ORDER — ONDANSETRON HCL 4 MG/2ML IJ SOLN
4.0000 mg | Freq: Once | INTRAMUSCULAR | Status: AC
  Administered 2015-11-26: 4 mg via INTRAVENOUS
  Filled 2015-11-26: qty 2

## 2015-11-26 MED ORDER — OXYCODONE-ACETAMINOPHEN 5-325 MG PO TABS: 2.0000 | ORAL_TABLET | Freq: Four times a day (QID) | ORAL | 0 refills | Status: DC | PRN

## 2015-11-26 MED ORDER — IOPAMIDOL (ISOVUE-300) INJECTION 61%
100.0000 mL | Freq: Once | INTRAVENOUS | Status: AC | PRN
  Administered 2015-11-26: 100 mL via INTRAVENOUS
  Filled 2015-11-26: qty 100

## 2015-11-26 NOTE — ED Notes (Signed)
E-signature box not working. Pt verbalized understanding of discharge instructions and denied questions. 

## 2015-11-26 NOTE — ED Triage Notes (Signed)
Pt states that he had just completed walking on the treadmill and exercising, pt states that he started having left lower abd pain, pt points at and below his umbilicus, pt c/o nausea, states pain doesn't remind of anything before.

## 2015-11-26 NOTE — ED Provider Notes (Signed)
[facility] Emergency Department Provider Note        Time seen: ----------------------------------------- 2:23 PM on 11/26/2015 -----------------------------------------    I have reviewed the triage vital signs and the nursing notes.   HISTORY  Chief Complaint Abdominal Pain    HPI Shown [name] is a 82 y.o. male who presents to ER for left lower quadrant pain. Patient states he was exercising and acutely developed left flank and left lower quadrant pain. He has never had this pain before, nothing makes it better. Patient describes it as a constant dull pain where he cannot get comfortable. His also had some nausea, he denies other complaints such as fever or blood in his stool. Patient states he's been moving his bowels and urinating normally.   No past medical history on file.  There are no active problems to display for this patient.   No past surgical history on file.  Allergies Penicillins and Tetanus toxoids  Social History Social History  Substance Use Topics  . Smoking status: Not on file  . Smokeless tobacco: Not on file  . Alcohol use Not on file    Review of Systems Constitutional: Negative for fever. Cardiovascular: Negative for chest pain. Respiratory: Negative for shortness of breath. Gastrointestinal: Positive for abdominal pain Genitourinary: Negative for dysuria. Musculoskeletal: Negative for back pain. Skin: Negative for rash. Neurological: Negative for headaches, focal weakness or numbness.  10-point ROS otherwise negative.  ____________________________________________   PHYSICAL EXAM:  VITAL SIGNS: ED Triage Vitals [11/26/15 1028]  Enc Vitals Group     BP (!) 212/79     Pulse Rate (!) 56     Resp 18     Temp      Temp src      SpO2 97 %     Weight 184 lb (83.5 kg)     Height 5\' 7"  (1.702 m)     Head Circumference      Peak Flow      Pain Score 5     Pain Loc      Pain Edu?      Excl. in [place]?      Constitutional: Alert and oriented. Well appearing and in no distress. Eyes: Conjunctivae are normal. PERRL. Normal extraocular movements. ENT   Head: Normocephalic and atraumatic.   Nose: No congestion/rhinnorhea.   Mouth/Throat: Mucous membranes are moist.   Neck: No stridor. Cardiovascular: Normal rate, regular rhythm. No murmurs, rubs, or gallops. Respiratory: Normal respiratory effort without tachypnea nor retractions. Breath sounds are clear and equal bilaterally. No wheezes/rales/rhonchi. Gastrointestinal: Soft, left lower quadrant tenderness, no rebound or guarding. Normal bowel sounds. Musculoskeletal: Nontender with normal range of motion in all extremities. No lower extremity tenderness nor edema. Neurologic:  Normal speech and language. No gross focal neurologic deficits are appreciated.  Skin:  Skin is warm, dry and intact. No rash noted. Psychiatric: Mood and affect are normal. Speech and behavior are normal.  ____________________________________________  EKG: Interpreted by me. Sinus bradycardia with a rate of 44 bpm, normal PR interval, normal QRS, normal QT interval, normal axis. Nonspecific ST and T-wave changes.  ____________________________________________  ED COURSE:  Pertinent labs & imaging results that were available during my care of the patient were reviewed by me and considered in my medical decision making (see chart for details). Patient presents with left for quadrant tenderness, he will require basic labs and CT imaging. ____________________________________________    LABS (pertinent positives/negatives)  Labs Reviewed  COMPREHENSIVE METABOLIC PANEL -  Abnormal; Notable for the following:       Result Value   Glucose, Bld 149 (*)    All other components within normal limits  LIPASE, BLOOD  CBC  TROPONIN I  URINALYSIS COMPLETEWITH MICROSCOPIC ([facility] ONLY)    RADIOLOGY Images were viewed by me  CT of the abdomen and pelvis with  contrast IMPRESSION: 3 mm proximal-mid left ureteral calculus causing moderate left hydronephrosis. 4 cm infrarenal abdominal aortic aneurysm without evidence of leak/rupture. Recommend followup by ultrasound in 1 year. This recommendation follows ACR consensus guidelines: White Paper of the ACR Incidental Findings Committee II on Vascular Findings. J Am Coll Radiol 2013; 10:789-794. Prostatomegaly and colonic diverticulosis. Bilateral inguinal hernias containing fat. Electronically Signed   By: Margarette Canada M.D. ____________________________________________  FINAL ASSESSMENT AND PLAN  Renal colic  Plan: Patient with labs and imaging as dictated above. Patient presented to the ER with acute left flank pain while exercising. He appears to have renal colic. I have advised him about his aneurysm and he will need outpatient follow-up. He'll be discharged with pain medication, Flomax and outpatient follow-up.   Earleen Newport, MD   Note: This dictation was prepared with Dragon dictation. Any transcriptional errors that result from this process are unintentional    Earleen Newport, MD 11/26/15 1549

## 2016-04-10 ENCOUNTER — Other Ambulatory Visit (INDEPENDENT_AMBULATORY_CARE_PROVIDER_SITE_OTHER): Payer: Self-pay | Admitting: Vascular Surgery

## 2016-04-10 DIAGNOSIS — I714 Abdominal aortic aneurysm, without rupture, unspecified: Secondary | ICD-10-CM | POA: Insufficient documentation

## 2016-04-13 ENCOUNTER — Ambulatory Visit (INDEPENDENT_AMBULATORY_CARE_PROVIDER_SITE_OTHER): Payer: Self-pay | Admitting: Vascular Surgery

## 2016-04-13 ENCOUNTER — Other Ambulatory Visit (INDEPENDENT_AMBULATORY_CARE_PROVIDER_SITE_OTHER): Payer: Self-pay

## 2016-04-14 ENCOUNTER — Ambulatory Visit
Admission: RE | Admit: 2016-04-14 | Discharge: 2016-04-14 | Disposition: A | Discharge status: Home / Self Care | Payer: Medicare Other | Source: Ambulatory Visit | Attending: Vascular Surgery | Admitting: Vascular Surgery

## 2016-04-14 DIAGNOSIS — I714 Abdominal aortic aneurysm, without rupture, unspecified: Secondary | ICD-10-CM

## 2016-04-14 DIAGNOSIS — N281 Cyst of kidney, acquired: Secondary | ICD-10-CM | POA: Diagnosis not present

## 2016-04-14 DIAGNOSIS — I700 Atherosclerosis of aorta: Secondary | ICD-10-CM | POA: Insufficient documentation

## 2016-04-14 DIAGNOSIS — R109 Unspecified abdominal pain: Secondary | ICD-10-CM | POA: Diagnosis present

## 2016-04-18 ENCOUNTER — Ambulatory Visit (INDEPENDENT_AMBULATORY_CARE_PROVIDER_SITE_OTHER): Payer: Medicare Other | Admitting: Vascular Surgery

## 2016-04-18 ENCOUNTER — Encounter (INDEPENDENT_AMBULATORY_CARE_PROVIDER_SITE_OTHER): Payer: Self-pay | Admitting: Vascular Surgery

## 2016-04-18 VITALS — BP 181/81 | HR 58 | Resp 16 | Wt 189.0 lb

## 2016-04-18 DIAGNOSIS — I714 Abdominal aortic aneurysm, without rupture, unspecified: Secondary | ICD-10-CM

## 2016-04-18 DIAGNOSIS — E785 Hyperlipidemia, unspecified: Secondary | ICD-10-CM | POA: Insufficient documentation

## 2016-04-18 DIAGNOSIS — E782 Mixed hyperlipidemia: Secondary | ICD-10-CM | POA: Insufficient documentation

## 2016-04-18 NOTE — Assessment & Plan Note (Signed)
lipid control important in reducing the progression of atherosclerotic disease. Continue statin therapy  

## 2016-04-18 NOTE — Progress Notes (Signed)
MRN : [id number]  Derrick Jackson is a 82 y.o. (01-Nov-1931) male who presents with chief complaint of  Chief Complaint  Patient presents with  . Follow-up  .  History of Present Illness: Patient returns today in follow up of AAA. Earlier this year, he had a CT scan which showed an incidental finding of a 4 cm abdominal aortic aneurysm. A follow-up ultrasound was performed recently which demonstrates a stable, approximately 3.7 cm infrarenal abdominal aortic aneurysm. There is no evidence of rupture. He remained symptom free. Specifically, the patient denies new back or abdominal pain, or signs of peripheral embolization   Current Outpatient Prescriptions  Medication Sig Dispense Refill  . atorvastatin (LIPITOR) 40 MG tablet     . niacin (NIASPAN) 500 MG CR tablet     . ondansetron (ZOFRAN) 4 MG tablet Take 1 tablet (4 mg total) by mouth daily as needed for nausea or vomiting. (Patient not taking: Reported on 04/18/2016) 20 tablet 1  . oxyCODONE-acetaminophen (PERCOCET) 5-325 MG tablet Take 2 tablets by mouth every 6 (six) hours as needed for moderate pain or severe pain. (Patient not taking: Reported on 04/18/2016) 30 tablet 0  . tamsulosin (FLOMAX) 0.4 MG CAPS capsule Take 1 capsule (0.4 mg total) by mouth daily after breakfast. (Patient not taking: Reported on 04/18/2016) 30 capsule 0   No current facility-administered medications for this visit.     History reviewed. No pertinent past medical history.  Past Surgical History:  Procedure Laterality Date  . CARDIAC SURGERY      Social History Social History  Substance Use Topics  . Smoking status: Never Smoker  . Smokeless tobacco: Never Used  . Alcohol use No  Married, wife accompanies him today  Family History Family History  Problem Relation Age of Onset  . Heart disease Mother   . Heart disease Father   No bleeding or clotting disorders  Allergies  Allergen Reactions  . Penicillins Hives  . Tetanus Toxoids  Other (See Comments)    Chest pain     REVIEW OF SYSTEMS (Negative unless checked)  Constitutional: [] Weight loss  [] Fever  [] Chills Cardiac: [] Chest pain   [] Chest pressure   [] Palpitations   [] Shortness of breath when laying flat   [] Shortness of breath at rest   [] Shortness of breath with exertion. Vascular:  [] Pain in legs with walking   [] Pain in legs at rest   [] Pain in legs when laying flat   [] Claudication   [] Pain in feet when walking  [] Pain in feet at rest  [] Pain in feet when laying flat   [] History of DVT   [] Phlebitis   [] Swelling in legs   [] Varicose veins   [] Non-healing ulcers Pulmonary:   [] Uses home oxygen   [] Productive cough   [] Hemoptysis   [] Wheeze  [] COPD   [] Asthma Neurologic:  [] Dizziness  [] Blackouts   [] Seizures   [] History of stroke   [] History of TIA  [] Aphasia   [] Temporary blindness   [] Dysphagia   [] Weakness or numbness in arms   [] Weakness or numbness in legs Musculoskeletal:  [] Arthritis   [] Joint swelling   [] Joint pain   [x] Low back pain Hematologic:  [] Easy bruising  [] Easy bleeding   [] Hypercoagulable state   [] Anemic   Gastrointestinal:  [] Blood in stool   [] Vomiting blood  [] Gastroesophageal reflux/heartburn   [] Abdominal pain Genitourinary:  [] Chronic kidney disease   [] Difficult urination  [] Frequent urination  [] Burning with urination   [] Hematuria Skin:  [] Rashes   [] Ulcers   []   Wounds Psychological:  [] History of anxiety   []  History of major depression.  Physical Examination  BP (!) 181/81   Pulse (!) 58   Resp 16   Wt 189 lb (85.7 kg)   BMI 29.60 kg/m  Gen:  WD/WN, NAD. Appears younger than stated age Head: [place]/AT, No temporalis wasting. Ear/Nose/Throat: Hearing grossly intact, nares w/o erythema or drainage, trachea midline Eyes: Conjunctiva clear. Sclera non-icteric Neck: Supple.  No JVD.  Pulmonary:  Good air movement, no use of accessory muscles.  Cardiac: RRR, normal S1, S2 Vascular:  Vessel Right Left  Radial Palpable Palpable                                    Gastrointestinal: soft, non-tender/non-distended. No guarding/reflex. Aortic impulse is enlarged Musculoskeletal: M/S 5/5 throughout.  No deformity or atrophy.  Neurologic: Sensation grossly intact in extremities.  Symmetrical.  Speech is fluent.  Psychiatric: Judgment intact, Mood & affect appropriate for pt's clinical situation. Dermatologic: No rashes or ulcers noted.  No cellulitis or open wounds. Lymph : No Cervical, Axillary, or Inguinal lymphadenopathy.      Labs No results found for this or any previous visit (from the past 2160 hour(s)).  Radiology [place] Retroperitoneal Comp  Result Date: 04/14/2016 CLINICAL DATA:  History of abdominal aortic aneurysm, abdominal pain EXAM: ULTRASOUND RETROPERITONEAL COMPLETE TECHNIQUE: Ultrasound examination of the abdominal aorta was performed to evaluate for abdominal aortic aneurysm. The common iliac arteries, IVC, and kidneys were also evaluated. COMPARISON:  11/26/2015 FINDINGS: Abdominal Aorta Diffuse aortic atherosclerosis evident. Small fusiform atherosclerotic infrarenal aortic aneurysm noted. Maximum AP Diameter:  3.7 cm Maximum TRV Diameter: 3.5 cm Right Common Iliac Artery No aneurysm identified. Left Common Iliac Artery No aneurysm identified. IVC No abnormality visualized. Right Kidney Length: 13.2 cm normal echogenicity. No hydronephrosis. Right kidney exophytic anechoic cyst extends off the lower pole measuring 4.2 x 4.2 x 4.0 cm Left Kidney Length: 11.6 cm normal echogenicity. No hydronephrosis. Left upper pole exophytic hypoechoic cyst measures 2.3 x 1.8 x 2.4 cm IMPRESSION: Aortic atherosclerosis with small infrarenal aortic aneurysm measuring 3.7 cm in greatest diameter. Recommend followup by ultrasound in 2 years. This recommendation follows ACR consensus guidelines: White Paper of the ACR Incidental Findings Committee II on Vascular Findings. J Am Coll Radiol 2013; 10:789-794. Incidental renal  cysts No acute finding. Electronically Signed   By: Jerilynn Mages.  Shick M.D.   On: 04/14/2016 09:52      Assessment/Plan  Hyperlipidemia lipid control important in reducing the progression of atherosclerotic disease. Continue statin therapy   AAA (abdominal aortic aneurysm) without rupture (HCC) No surgery or intervention at this time. The patient has an asymptomatic abdominal aortic aneurysm that is less than 4 cm in maximal diameter.  I have discussed the natural history of abdominal aortic aneurysm and the small risk of rupture for aneurysm less than 5 cm in size.  However, as these small aneurysms tend to enlarge over time, continued surveillance with ultrasound or CT scan is mandatory.  I have also discussed optimizing medical management with hypertension and lipid control and the importance of abstinence from tobacco.  The patient is also encouraged to exercise a minimum of 30 minutes 4 times a week.  Should the patient develop new onset abdominal or back pain or signs of peripheral embolization they are instructed to seek medical attention immediately and to alert the physician providing care that they have an  aneurysm.  The patient voices their understanding. The patient will return in 12 months with an aortic duplex.     Leotis Pain, MD  04/18/2016 3:13 PM    This note was created with Dragon medical transcription system.  Any errors from dictation are purely unintentional

## 2016-04-18 NOTE — Assessment & Plan Note (Signed)
No surgery or intervention at this time. The patient has an asymptomatic abdominal aortic aneurysm that is less than 4 cm in maximal diameter.  I have discussed the natural history of abdominal aortic aneurysm and the small risk of rupture for aneurysm less than 5 cm in size.  However, as these small aneurysms tend to enlarge over time, continued surveillance with ultrasound or CT scan is mandatory.  I have also discussed optimizing medical management with hypertension and lipid control and the importance of abstinence from tobacco.  The patient is also encouraged to exercise a minimum of 30 minutes 4 times a week.  Should the patient develop new onset abdominal or back pain or signs of peripheral embolization they are instructed to seek medical attention immediately and to alert the physician providing care that they have an aneurysm.  The patient voices their understanding. The patient will return in 12 months with an aortic duplex.  

## 2016-04-18 NOTE — Patient Instructions (Signed)
Abdominal Aortic Aneurysm Blood pumps away from the heart through tubes (blood vessels) called arteries. Aneurysms are weak or damaged places in the wall of an artery. It bulges out like a balloon. An abdominal aortic aneurysm happens in the main artery of the body (aorta). It can burst or tear, causing bleeding inside the body. This is an emergency. It needs treatment right away. What are the causes? The exact cause is unknown. Things that could cause this problem include:  Fat and other substances building up in the lining of a tube.  Swelling of the walls of a blood vessel.  Certain tissue diseases.  Belly (abdominal) trauma.  An infection in the main artery of the body.  What increases the risk? There are things that make it more likely for you to have an aneurysm. These include:  Being over the age of 80 years old.  Having high blood pressure (hypertension).  Being a male.  Being white.  Being very overweight (obese).  Having a family history of aneurysm.  Using tobacco products.  What are the signs or symptoms? Symptoms depend on the size of the aneurysm and how fast it grows. There may not be symptoms. If symptoms occur, they can include:  Pain (belly, side, lower back, or groin).  Feeling full after eating a small amount of food.  Feeling sick to your stomach (nauseous), throwing up (vomiting), or both.  Feeling a lump in your belly that feels like it is beating (pulsating).  Feeling like you will pass out (faint).  How is this treated?  Medicine to control blood pressure and pain.  Imaging tests to see if the aneurysm gets bigger.  Surgery. How is this prevented? To lessen your chance of getting this condition:  Stop smoking. Stop chewing tobacco.  Limit or avoid alcohol.  Keep your blood pressure, blood sugar, and cholesterol within normal limits.  Eat less salt.  Eat foods low in saturated fats and cholesterol. These are found in animal and  whole dairy products.  Eat more fiber. Fiber is found in whole grains, vegetables, and fruits.  Keep a healthy weight.  Stay active and exercise often.  This information is not intended to replace advice given to you by your health care provider. Make sure you discuss any questions you have with your health care provider. Document Released: 08/12/2012 Document Revised: 09/23/2015 Document Reviewed: 05/17/2012 Elsevier Interactive Patient Education  2017 [employer]  

## 2017-04-20 ENCOUNTER — Ambulatory Visit (INDEPENDENT_AMBULATORY_CARE_PROVIDER_SITE_OTHER): Payer: Medicare Other

## 2017-04-20 ENCOUNTER — Ambulatory Visit (INDEPENDENT_AMBULATORY_CARE_PROVIDER_SITE_OTHER): Payer: Medicare Other | Admitting: Vascular Surgery

## 2017-04-20 ENCOUNTER — Encounter (INDEPENDENT_AMBULATORY_CARE_PROVIDER_SITE_OTHER): Payer: Self-pay | Admitting: Vascular Surgery

## 2017-04-20 VITALS — BP 153/76 | HR 60 | Resp 15 | Ht 70.0 in | Wt 189.0 lb

## 2017-04-20 DIAGNOSIS — I714 Abdominal aortic aneurysm, without rupture, unspecified: Secondary | ICD-10-CM

## 2017-04-20 DIAGNOSIS — E785 Hyperlipidemia, unspecified: Secondary | ICD-10-CM

## 2017-04-20 NOTE — Progress Notes (Signed)
MRN : [id number]  Derrick Jackson is a 83 y.o. (Dec 16, 1931) male who presents with chief complaint of  Chief Complaint  Patient presents with  . AAA    1 year AAA f/u  .  History of Present Illness: Patient returns today in follow up of his abdominal aortic aneurysm.  He is doing well without complaints today.  He denies any aneurysm related symptoms. Specifically, the patient denies new back or abdominal pain, or signs of peripheral embolization. His aortic duplex shows a stable 4.0 cm AAA.  Current Outpatient Prescriptions  Medication Sig Dispense Refill  . atorvastatin (LIPITOR) 40 MG tablet     . niacin (NIASPAN) 500 MG CR tablet     . ondansetron (ZOFRAN) 4 MG tablet Take 1 tablet (4 mg total) by mouth daily as needed for nausea or vomiting. (Patient not taking: Reported on 04/18/2016) 20 tablet 1  . oxyCODONE-acetaminophen (PERCOCET) 5-325 MG tablet Take 2 tablets by mouth every 6 (six) hours as needed for moderate pain or severe pain. (Patient not taking: Reported on 04/18/2016) 30 tablet 0  . tamsulosin (FLOMAX) 0.4 MG CAPS capsule Take 1 capsule (0.4 mg total) by mouth daily after breakfast. (Patient not taking: Reported on 04/18/2016) 30 capsule 0   No current facility-administered medications for this visit.     History reviewed. No pertinent past medical history.       Past Surgical History:  Procedure Laterality Date  . CARDIAC SURGERY      Social History     Social History  Substance Use Topics  . Smoking status: Never Smoker  . Smokeless tobacco: Never Used  . Alcohol use No  Married, wife accompanies him today  Family History      Family History  Problem Relation Age of Onset  . Heart disease Mother   . Heart disease Father   No bleeding or clotting disorders       Allergies  Allergen Reactions  . Penicillins Hives  . Tetanus Toxoids Other (See Comments)    Chest pain     REVIEW OF SYSTEMS (Negative unless  checked)  Constitutional: [] Weight loss  [] Fever  [] Chills Cardiac: [] Chest pain   [] Chest pressure   [] Palpitations   [] Shortness of breath when laying flat   [] Shortness of breath at rest   [] Shortness of breath with exertion. Vascular:  [] Pain in legs with walking   [] Pain in legs at rest   [] Pain in legs when laying flat   [] Claudication   [] Pain in feet when walking  [] Pain in feet at rest  [] Pain in feet when laying flat   [] History of DVT   [] Phlebitis   [] Swelling in legs   [] Varicose veins   [] Non-healing ulcers Pulmonary:   [] Uses home oxygen   [] Productive cough   [] Hemoptysis   [] Wheeze  [] COPD   [] Asthma Neurologic:  [] Dizziness  [] Blackouts   [] Seizures   [] History of stroke   [] History of TIA  [] Aphasia   [] Temporary blindness   [] Dysphagia   [] Weakness or numbness in arms   [] Weakness or numbness in legs Musculoskeletal:  [x] Arthritis   [] Joint swelling   [] Joint pain   [x] Low back pain Hematologic:  [] Easy bruising  [] Easy bleeding   [] Hypercoagulable state   [] Anemic   Gastrointestinal:  [] Blood in stool   [] Vomiting blood  [] Gastroesophageal reflux/heartburn   [] Abdominal pain Genitourinary:  [] Chronic kidney disease   [] Difficult urination  [] Frequent urination  [] Burning with urination   [] Hematuria Skin:  []   Rashes   [] Ulcers   [] Wounds Psychological:  [] History of anxiety   []  History of major depression.     Physical Examination  BP (!) 153/76 (BP Location: Right Arm, Patient Position: Sitting)   Pulse 60   Resp 15   Ht 5\' 10"  (1.778 m)   Wt 85.7 kg (189 lb)   BMI 27.12 kg/m  Gen:  WD/WN, NAD.  Appears much younger than stated age Head: [place]/AT, No temporalis wasting. Ear/Nose/Throat: Hearing grossly intact, nares w/o erythema or drainage, trachea midline Eyes: Conjunctiva clear. Sclera non-icteric Neck: Supple.  No JVD.  Pulmonary:  Good air movement, no use of accessory muscles.  Cardiac: RRR, normal S1, S2 Vascular:  Vessel Right Left  Radial Palpable  Palpable                                   Gastrointestinal: soft, non-tender/non-distended.  Mildly increased aortic impulse Musculoskeletal: M/S 5/5 throughout.  No deformity or atrophy.  No edema. Neurologic: Sensation grossly intact in extremities.  Symmetrical.  Speech is fluent.  Psychiatric: Judgment intact, Mood & affect appropriate for pt's clinical situation. Dermatologic: No rashes or ulcers noted.  No cellulitis or open wounds.       Labs No results found for this or any previous visit (from the past 2160 hour(s)).  Radiology No results found.    Assessment/Plan Hyperlipidemia lipid control important in reducing the progression of atherosclerotic disease. Continue statin therapy   AAA (abdominal aortic aneurysm) without rupture (HCC) No surgery or intervention at this time. The patient has an asymptomatic abdominal aortic aneurysm that is 4 cm in maximal diameter.  I have discussed the natural history of abdominal aortic aneurysm and the small risk of rupture for aneurysm less than 5 cm in size.  However, as these small aneurysms tend to enlarge over time, continued surveillance with ultrasound or CT scan is mandatory.  I have also discussed optimizing medical management with hypertension and lipid control and the importance of abstinence from tobacco.  The patient is also encouraged to exercise a minimum of 30 minutes 4 times a week.  Should the patient develop new onset abdominal or back pain or signs of peripheral embolization they are instructed to seek medical attention immediately and to alert the physician providing care that they have an aneurysm.  The patient voices their understanding. The patient will return in 12 months with an aortic duplex.       Leotis Pain, MD  04/20/2017 9:09 AM    This note was created with Dragon medical transcription system.  Any errors from dictation are purely unintentional

## 2017-04-20 NOTE — Patient Instructions (Signed)
Abdominal Aortic Aneurysm Blood pumps away from the heart through tubes (blood vessels) called arteries. Aneurysms are weak or damaged places in the wall of an artery. It bulges out like a balloon. An abdominal aortic aneurysm happens in the main artery of the body (aorta). It can burst or tear, causing bleeding inside the body. This is an emergency. It needs treatment right away. What are the causes? The exact cause is unknown. Things that could cause this problem include:  Fat and other substances building up in the lining of a tube.  Swelling of the walls of a blood vessel.  Certain tissue diseases.  Belly (abdominal) trauma.  An infection in the main artery of the body.  What increases the risk? There are things that make it more likely for you to have an aneurysm. These include:  Being over the age of 81 years old.  Having high blood pressure (hypertension).  Being a male.  Being white.  Being very overweight (obese).  Having a family history of aneurysm.  Using tobacco products.  What are the signs or symptoms? Symptoms depend on the size of the aneurysm and how fast it grows. There may not be symptoms. If symptoms occur, they can include:  Pain (belly, side, lower back, or groin).  Feeling full after eating a small amount of food.  Feeling sick to your stomach (nauseous), throwing up (vomiting), or both.  Feeling a lump in your belly that feels like it is beating (pulsating).  Feeling like you will pass out (faint).  How is this treated?  Medicine to control blood pressure and pain.  Imaging tests to see if the aneurysm gets bigger.  Surgery. How is this prevented? To lessen your chance of getting this condition:  Stop smoking. Stop chewing tobacco.  Limit or avoid alcohol.  Keep your blood pressure, blood sugar, and cholesterol within normal limits.  Eat less salt.  Eat foods low in saturated fats and cholesterol. These are found in animal and  whole dairy products.  Eat more fiber. Fiber is found in whole grains, vegetables, and fruits.  Keep a healthy weight.  Stay active and exercise often.  This information is not intended to replace advice given to you by your health care provider. Make sure you discuss any questions you have with your health care provider. Document Released: 08/12/2012 Document Revised: 09/23/2015 Document Reviewed: 05/17/2012 Elsevier Interactive Patient Education  2017 [employer] Inc[employer]  

## 2017-08-29 DIAGNOSIS — Z85828 Personal history of other malignant neoplasm of skin: Secondary | ICD-10-CM

## 2017-08-29 HISTORY — DX: Personal history of other malignant neoplasm of skin: Z85.828

## 2017-12-26 DIAGNOSIS — R001 Bradycardia, unspecified: Secondary | ICD-10-CM | POA: Insufficient documentation

## 2017-12-26 DIAGNOSIS — I440 Atrioventricular block, first degree: Secondary | ICD-10-CM | POA: Insufficient documentation

## 2017-12-26 DIAGNOSIS — I517 Cardiomegaly: Secondary | ICD-10-CM | POA: Insufficient documentation

## 2018-03-25 DIAGNOSIS — S46211A Strain of muscle, fascia and tendon of other parts of biceps, right arm, initial encounter: Secondary | ICD-10-CM | POA: Insufficient documentation

## 2018-04-10 ENCOUNTER — Other Ambulatory Visit (INDEPENDENT_AMBULATORY_CARE_PROVIDER_SITE_OTHER): Payer: Self-pay | Admitting: Vascular Surgery

## 2018-04-10 DIAGNOSIS — I714 Abdominal aortic aneurysm, without rupture, unspecified: Secondary | ICD-10-CM

## 2018-04-19 ENCOUNTER — Ambulatory Visit (INDEPENDENT_AMBULATORY_CARE_PROVIDER_SITE_OTHER): Payer: Medicare Other | Admitting: Vascular Surgery

## 2018-04-19 ENCOUNTER — Encounter (INDEPENDENT_AMBULATORY_CARE_PROVIDER_SITE_OTHER): Payer: Self-pay | Admitting: Vascular Surgery

## 2018-04-19 ENCOUNTER — Ambulatory Visit (INDEPENDENT_AMBULATORY_CARE_PROVIDER_SITE_OTHER): Payer: Medicare Other

## 2018-04-19 VITALS — BP 153/77 | HR 53 | Resp 14 | Ht 67.0 in | Wt 183.0 lb

## 2018-04-19 DIAGNOSIS — E785 Hyperlipidemia, unspecified: Secondary | ICD-10-CM | POA: Diagnosis not present

## 2018-04-19 DIAGNOSIS — I714 Abdominal aortic aneurysm, without rupture, unspecified: Secondary | ICD-10-CM

## 2018-04-19 NOTE — Patient Instructions (Signed)
Abdominal Aortic Aneurysm    An aneurysm is a bulge in one of the blood vessels that carry blood away from the heart (artery). It happens when blood pushes up against a weak or damaged place in the wall of an artery. An abdominal aortic aneurysm happens in the main artery of the body (aorta).  Some aneurysms may not cause problems. If it grows, it can burst or tear, causing bleeding inside the body. This is an emergency. It needs to be treated right away.  What are the causes?  The exact cause of this condition is not known.  What increases the risk?  The following may make you more likely to get this condition:   Being a male who is 60 years of age or older.   Being white (Caucasian).   Using tobacco.   Having a family history of aneurysms.   Having the following conditions:  ? Hardening of the arteries (arteriosclerosis).  ? Inflammation of the walls of an artery (arteritis).  ? Certain genetic conditions.  ? Being very overweight (obesity).  ? An infection in the wall of the aorta (infectious aortitis).  ? High cholesterol.  ? High blood pressure (hypertension).  What are the signs or symptoms?  Symptoms depend on the size of the aneurysm and how fast it is growing. Most grow slowly and do not cause any symptoms. If symptoms do occur, they may include:   Pain in the belly (abdomen), side, or back.   Feeling full after eating only small amounts of food.   Feeling a throbbing lump in the belly.  Symptoms that the aneurysm has burst (ruptured) include:   Sudden, very bad pain in the belly, side, or back.   Feeling sick to your stomach (nauseous).   Throwing up (vomiting).   Feeling light-headed or passing out.  How is this treated?  Treatment for this condition depends on:   The size of the aneurysm.   How fast it is growing.   Your age.   Your risk of having it burst.  If your aneurysm is smaller than 2 inches (5 cm), your doctor may manage it by:   Checking it often to see if it is getting bigger.  You may have an imaging test (ultrasound) to check it every 3-6 months, every year, or every few years.   Giving you medicines to:  ? Control blood pressure.  ? Treat pain.  ? Fight infection.  If your aneurysm is larger than 2 inches (5 cm), you may need surgery to fix it.  Follow these instructions at home:  Lifestyle   Do not use any products that have nicotine or tobacco in them. This includes cigarettes, e-cigarettes, and chewing tobacco. If you need help quitting, ask your doctor.   Get regular exercise. Ask your doctor what types of exercise are best for you.  Eating and drinking   Eat a heart-healthy diet. This includes eating plenty of:  ? Fresh fruits and vegetables.  ? Whole grains.  ? Low-fat (lean) protein.  ? Low-fat dairy products.   Avoid foods that are high in saturated fat and cholesterol. These foods include red meat and some dairy products.   Do not drink alcohol if:  ? Your doctor tells you not to drink.  ? You are pregnant, may be pregnant, or are planning to become pregnant.   If you drink alcohol:  ? Limit how much you use to:   0-1 drink a day for women.     0-2 drinks a day for men.  ? Be aware of how much alcohol is in your drink. In the [place] one drink equals any of these:   One typical bottle of beer (12 oz).   One-half glass of wine (5 oz).   One shot of hard liquor (1 oz).  General instructions   Take over-the-counter and prescription medicines only as told by your doctor.   Keep your blood pressure within normal limits. Ask your doctor what your blood pressure should be.   Have your blood sugar (glucose) level and cholesterol levels checked regularly. Keep your blood sugar level and cholesterol levels within normal limits.   Avoid heavy lifting and activities that take a lot of effort. Ask your doctor what activities are safe for you.   Keep all follow-up visits as told by your doctor. This is important.  ? Talk to your doctor about regular screenings to see if the  aneurysm is getting bigger.  Contact a doctor if you:   Have pain in your belly, side, or back.   Have a throbbing feeling in your belly.   Have a family history of aneurysms.  Get help right away if you:   Have sudden, bad pain in your belly, side, or back.   Feel sick to your stomach.   Throw up.   Have trouble pooping (constipation).   Have trouble peeing (urinating).   Feel light-headed.   Have a fast heart rate when you stand.   Have sweaty skin that is cold to the touch (clammy).   Have shortness of breath.   Have a fever.  These symptoms may be an emergency. Do not wait to see if the symptoms will go away. Get medical help right away. Call your local emergency services (911 in the [place] Do not drive yourself to the hospital.  Summary   An aneurysm is a bulge in one of the blood vessels that carry blood away from the heart (artery). Some aneurysms may not cause problems.   You may need to have yours checked often. If it grows, it can burst or tear. This causes bleeding inside the body. It needs to be treated right away.   Follow instructions from your doctor about healthy lifestyle changes.   Keep all follow-up visits as told by your doctor. This is important.  This information is not intended to replace advice given to you by your health care provider. Make sure you discuss any questions you have with your health care provider.  Document Released: 08/12/2012 Document Revised: 11/24/2017 Document Reviewed: 11/24/2017  Elsevier Interactive Patient Education  2019 Else[employer] Inc[employer]

## 2018-04-19 NOTE — Assessment & Plan Note (Signed)
His aortic duplex today shows a stable, 4.0 cm in maximal diameter abdominal aortic aneurysm. At this time, we will continue yearly aortic duplex studies.  We discussed the importance of remaining abstinent from tobacco and controlling his blood pressure.  Contact our office with any problems in the interim.

## 2018-04-19 NOTE — Progress Notes (Signed)
MRN : [id number]  Derrick Jackson is a 84 y.o. (Jul 01, 1931) male who presents with chief complaint of  Chief Complaint  Patient presents with  . Follow-up  .  History of Present Illness: Patient returns today in follow up of his abdominal aortic aneurysm.  He is doing well today without specific complaints.  He denies any aneurysm related symptoms. Specifically, the patient denies new back or abdominal pain, or signs of peripheral embolization His aortic duplex today shows a stable, 4.0 cm in maximal diameter abdominal aortic aneurysm.  Current Outpatient Medications  Medication Sig Dispense Refill  . aspirin EC 81 MG tablet Take by mouth.    Marland Kitchen atorvastatin (LIPITOR) 40 MG tablet     . ramipril (ALTACE) 2.5 MG capsule Take 2.5 mg by mouth daily.     No current facility-administered medications for this visit.     Past Medical History:  Diagnosis Date  . AAA (abdominal aortic aneurysm) ([place])   . Hyperlipidemia     Past Surgical History:  Procedure Laterality Date  . CARDIAC SURGERY      Social History  Substance Use Topics  . Smoking status: Never Smoker  . Smokeless tobacco: Never Used  . Alcohol use No  Married, wife accompanies him today  Family History      Family History  Problem Relation Age of Onset  . Heart disease Mother   . Heart disease Father   No bleeding or clotting disorders       Allergies  Allergen Reactions  . Penicillins Hives  . Tetanus Toxoids Other (See Comments)    Chest pain     REVIEW OF SYSTEMS (Negative unless checked)  Constitutional: [] ?Weight loss  [] ?Fever  [] ?Chills Cardiac: [] ?Chest pain   [] ?Chest pressure   [] ?Palpitations   [] ?Shortness of breath when laying flat   [] ?Shortness of breath at rest   [] ?Shortness of breath with exertion. Vascular:  [] ?Pain in legs with walking   [] ?Pain in legs at rest   [] ?Pain in legs when laying flat   [] ?Claudication   [] ?Pain in feet when walking  [] ?Pain in feet at rest   [] ?Pain in feet when laying flat   [] ?History of DVT   [] ?Phlebitis   [] ?Swelling in legs   [] ?Varicose veins   [] ?Non-healing ulcers Pulmonary:   [] ?Uses home oxygen   [] ?Productive cough   [] ?Hemoptysis   [] ?Wheeze  [] ?COPD   [] ?Asthma Neurologic:  [] ?Dizziness  [] ?Blackouts   [] ?Seizures   [] ?History of stroke   [] ?History of TIA  [] ?Aphasia   [] ?Temporary blindness   [] ?Dysphagia   [] ?Weakness or numbness in arms   [] ?Weakness or numbness in legs Musculoskeletal:  [] ?Arthritis   [] ?Joint swelling   [] ?Joint pain   [x] ?Low back pain Hematologic:  [] ?Easy bruising  [] ?Easy bleeding   [] ?Hypercoagulable state   [] ?Anemic   Gastrointestinal:  [] ?Blood in stool   [] ?Vomiting blood  [] ?Gastroesophageal reflux/heartburn   [] ?Abdominal pain Genitourinary:  [] ?Chronic kidney disease   [] ?Difficult urination  [] ?Frequent urination  [] ?Burning with urination   [] ?Hematuria Skin:  [] ?Rashes   [] ?Ulcers   [] ?Wounds Psychological:  [] ?History of anxiety   [] ? History of major depression.    Physical Examination  BP (!) 153/77 (BP Location: Right Arm, Patient Position: Sitting)   Pulse (!) 53   Resp 14   Ht 5\' 7"  (1.702 m)   Wt 183 lb (83 kg)   BMI 28.66 kg/m  Gen:  WD/WN, NAD.  Appears younger than stated  age Head: [place]/AT, No temporalis wasting. Ear/Nose/Throat: Hearing grossly intact, nares w/o erythema or drainage Eyes: Conjunctiva clear. Sclera non-icteric Neck: Supple.  Trachea midline Pulmonary:  Good air movement, no use of accessory muscles.  Cardiac: RRR, no JVD Vascular:  Vessel Right Left  Radial Palpable Palpable                          PT Palpable Palpable  DP Palpable Palpable   Gastrointestinal: soft, non-tender/non-distended.  Mildly increased aortic impulse Musculoskeletal: M/S 5/5 throughout.  No deformity or atrophy. No edema. Neurologic: Sensation grossly intact in extremities.  Symmetrical.  Speech is fluent.  Psychiatric: Judgment intact, Mood & affect  appropriate for pt's clinical situation. Dermatologic: No rashes or ulcers noted.  No cellulitis or open wounds.       Labs No results found for this or any previous visit (from the past 2160 hour(s)).  Radiology No results found.  Assessment/Plan  Hyperlipidemia lipid control important in reducing the progression of atherosclerotic disease. Continue statin therapy   AAA (abdominal aortic aneurysm) without rupture (HCC) His aortic duplex today shows a stable, 4.0 cm in maximal diameter abdominal aortic aneurysm. At this time, we will continue yearly aortic duplex studies.  We discussed the importance of remaining abstinent from tobacco and controlling his blood pressure.  Contact our office with any problems in the interim.    Leotis Pain, MD  04/19/2018 9:26 AM    This note was created with Dragon medical transcription system.  Any errors from dictation are purely unintentional

## 2018-04-19 NOTE — Assessment & Plan Note (Signed)
lipid control important in reducing the progression of atherosclerotic disease. Continue statin therapy  

## 2019-04-21 ENCOUNTER — Encounter (INDEPENDENT_AMBULATORY_CARE_PROVIDER_SITE_OTHER): Payer: Self-pay | Admitting: Nurse Practitioner

## 2019-04-21 ENCOUNTER — Other Ambulatory Visit: Payer: Self-pay

## 2019-04-21 ENCOUNTER — Ambulatory Visit (INDEPENDENT_AMBULATORY_CARE_PROVIDER_SITE_OTHER): Payer: Medicare Other

## 2019-04-21 ENCOUNTER — Ambulatory Visit (INDEPENDENT_AMBULATORY_CARE_PROVIDER_SITE_OTHER): Payer: Medicare Other | Admitting: Nurse Practitioner

## 2019-04-21 ENCOUNTER — Encounter (INDEPENDENT_AMBULATORY_CARE_PROVIDER_SITE_OTHER): Payer: Self-pay

## 2019-04-21 VITALS — BP 157/78 | HR 59 | Resp 17 | Ht 65.0 in | Wt 175.0 lb

## 2019-04-21 DIAGNOSIS — I714 Abdominal aortic aneurysm, without rupture, unspecified: Secondary | ICD-10-CM

## 2019-04-21 DIAGNOSIS — Z01812 Encounter for preprocedural laboratory examination: Secondary | ICD-10-CM

## 2019-04-21 DIAGNOSIS — E782 Mixed hyperlipidemia: Secondary | ICD-10-CM | POA: Diagnosis not present

## 2019-04-21 DIAGNOSIS — I2510 Atherosclerotic heart disease of native coronary artery without angina pectoris: Secondary | ICD-10-CM | POA: Insufficient documentation

## 2019-04-21 DIAGNOSIS — I10 Essential (primary) hypertension: Secondary | ICD-10-CM

## 2019-04-27 ENCOUNTER — Encounter (INDEPENDENT_AMBULATORY_CARE_PROVIDER_SITE_OTHER): Payer: Self-pay | Admitting: Nurse Practitioner

## 2019-04-27 DIAGNOSIS — Z01812 Encounter for preprocedural laboratory examination: Secondary | ICD-10-CM | POA: Insufficient documentation

## 2019-04-27 NOTE — Progress Notes (Signed)
SUBJECTIVE:  Patient ID: Derrick Jackson, male    DOB: 1932-02-24, 85 y.o.   MRN: [id number] Chief Complaint  Patient presents with  . Follow-up    HPI  Derrick Jackson is a 85 y.o. male The patient returns to the office for surveillance of a known abdominal aortic aneurysm. Patient denies abdominal pain or back pain, no other abdominal complaints. No changes suggesting embolic episodes.   There have been no interval changes in the patient's overall health care since his last visit.  Patient denies amaurosis fugax or TIA symptoms. There is no history of claudication or rest pain symptoms of the lower extremities. The patient denies angina or shortness of breath.   Duplex US of the aorta and iliac arteries shows an AAA measured 4.9 cm with 2.3 right common iliac artery aneurysms.  Previously, the patient's aneurysm measured 4.0 on 04/19/2018 as well as the previous follow-up study in 2018.  Past Medical History:  Diagnosis Date  . AAA (abdominal aortic aneurysm) ([place])   . Hx of basal cell carcinoma 08/29/2017   R infrascapular back, R distal lat. bicep near elbow  . Hyperlipidemia     Past Surgical History:  Procedure Laterality Date  . CARDIAC SURGERY      Social History   Socioeconomic History  . Marital status: Married    Spouse name: Not on file  . Number of children: Not on file  . Years of education: Not on file  . Highest education level: Not on file  Occupational History  . Not on file  Tobacco Use  . Smoking status: Never Smoker  . Smokeless tobacco: Never Used  Substance and Sexual Activity  . Alcohol use: No  . Drug use: No  . Sexual activity: Not on file  Other Topics Concern  . Not on file  Social History Narrative  . Not on file   Social Determinants of Health   Financial Resource Strain:   . Difficulty of Paying Living Expenses: Not on file  Food Insecurity:   . Worried About Charity fundraiser in the Last Year: Not on file  . Ran Out  of Food in the Last Year: Not on file  Transportation Needs:   . Lack of Transportation (Medical): Not on file  . Lack of Transportation (Non-Medical): Not on file  Physical Activity:   . Days of Exercise per Week: Not on file  . Minutes of Exercise per Session: Not on file  Stress:   . Feeling of Stress : Not on file  Social Connections:   . Frequency of Communication with Friends and Family: Not on file  . Frequency of Social Gatherings with Friends and Family: Not on file  . Attends Religious Services: Not on file  . Active Member of Clubs or Organizations: Not on file  . Attends Archivist Meetings: Not on file  . Marital Status: Not on file  Intimate Partner Violence:   . Fear of Current or Ex-Partner: Not on file  . Emotionally Abused: Not on file  . Physically Abused: Not on file  . Sexually Abused: Not on file    Family History  Problem Relation Age of Onset  . Heart disease Mother   . Heart disease Father     Allergies  Allergen Reactions  . Penicillins Hives  . Tetanus Antitoxin   . Tetanus Toxoids Other (See Comments)    Chest pain     Review of Systems   Review of  Systems: Negative Unless Checked Constitutional: [] Weight loss  [] Fever  [] Chills Cardiac: [] Chest pain   []  Atrial Fibrillation  [] Palpitations   [] Shortness of breath when laying flat   [] Shortness of breath with exertion. [] Shortness of breath at rest Vascular:  [] Pain in legs with walking   [] Pain in legs with standing [] Pain in legs when laying flat   [] Claudication    [] Pain in feet when laying flat    [] History of DVT   [] Phlebitis   [] Swelling in legs   [] Varicose veins   [] Non-healing ulcers Pulmonary:   [] Uses home oxygen   [] Productive cough   [] Hemoptysis   [] Wheeze  [] COPD   [] Asthma Neurologic:  [] Dizziness   [] Seizures  [] Blackouts [] History of stroke   [] History of TIA  [] Aphasia   [] Temporary Blindness   [] Weakness or numbness in arm   [] Weakness or numbness in  leg Musculoskeletal:   [] Joint swelling   [] Joint pain   [] Low back pain  []  History of Knee Replacement [x] Arthritis [] back Surgeries  []  Spinal Stenosis    Hematologic:  [] Easy bruising  [] Easy bleeding   [] Hypercoagulable state   [] Anemic Gastrointestinal:  [] Diarrhea   [] Vomiting  [] Gastroesophageal reflux/heartburn   [] Difficulty swallowing. [] Abdominal pain Genitourinary:  [] Chronic kidney disease   [] Difficult urination  [] Anuric   [] Blood in urine [] Frequent urination  [] Burning with urination   [] Hematuria Skin:  [] Rashes   [] Ulcers [] Wounds Psychological:  [] History of anxiety   []  History of major depression  []  Memory Difficulties      OBJECTIVE:   Physical Exam  BP (!) 157/78 (BP Location: Right Arm)   Pulse (!) 59   Resp 17   Ht 5\' 5"  (1.651 m)   Wt 175 lb (79.4 kg)   BMI 29.12 kg/m   Gen: WD/WN, NAD Head: [place]/AT, No temporalis wasting.  Ear/Nose/Throat: Hearing grossly intact, nares w/o erythema or drainage Eyes: PER, EOMI, sclera nonicteric.  Neck: Supple, no masses.  No JVD.  Pulmonary:  Good air movement, no use of accessory muscles.  Cardiac: RRR Vascular:  Vessel Right Left  Radial Palpable Palpable  Dorsalis Pedis Palpable Palpable  Posterior Tibial Palpable Palpable   Gastrointestinal: soft, non-distended. No guarding/no peritoneal signs.  Musculoskeletal: M/S 5/5 throughout.  No deformity or atrophy.  Neurologic: Pain and light touch intact in extremities.  Symmetrical.  Speech is fluent. Motor exam as listed above. Psychiatric: Judgment intact, Mood & affect appropriate for pt's clinical situation. Dermatologic: No Venous rashes. No Ulcers Noted.  No changes consistent with cellulitis. Lymph : No Cervical lymphadenopathy, no lichenification or skin changes of chronic lymphedema.       ASSESSMENT AND PLAN:   1. Pre-procedural laboratory examination Creatinine will be needed for CT - Creatinine, IStat; Future  2. AAA (abdominal aortic aneurysm)  without rupture ([place]) The patient has had a rapid progression and growth between noninvasive studies.  There is nearly been 1 full centimeter of growth in 1 year.  Based on this, we will obtain a CT angiogram to determine if this is accurate.  If so, discussion about invasive intervention will take place at the follow-up visit when the CT scan is reviewed.  Patient and wife agree and are aware. - CT Angio Abd/Pel w/ and/or w/o; Future  3. Benign essential hypertension Continue antihypertensive medications as already ordered, these medications have been reviewed and there are no changes at this time.   4. Mixed hyperlipidemia Continue statin as ordered and reviewed, no changes at this time  Current Outpatient Medications on File Prior to Visit  Medication Sig Dispense Refill  . amLODipine (NORVASC) 2.5 MG tablet     . aspirin EC 81 MG tablet Take by mouth.    Marland Kitchen atorvastatin (LIPITOR) 40 MG tablet     . ramipril (ALTACE) 2.5 MG capsule Take 2.5 mg by mouth daily.     No current facility-administered medications on file prior to visit.    There are no Patient Instructions on file for this visit. No follow-ups on file.   Kris Hartmann, NP  This note was completed with Sales executive.  Any errors are purely unintentional.

## 2019-05-06 ENCOUNTER — Telehealth (INDEPENDENT_AMBULATORY_CARE_PROVIDER_SITE_OTHER): Payer: Self-pay

## 2019-05-06 NOTE — Telephone Encounter (Signed)
Per Eulogio Ditch NP the patient can schedule his CT with radiology. Patient was made aware and advise to call the office to schedule appointment with Dr Lucky Cowboy to go over results

## 2019-05-13 ENCOUNTER — Ambulatory Visit
Admission: RE | Admit: 2019-05-13 | Discharge: 2019-05-13 | Disposition: A | Discharge status: Home / Self Care | Payer: Medicare Other | Source: Ambulatory Visit | Attending: Nurse Practitioner | Admitting: Nurse Practitioner

## 2019-05-13 ENCOUNTER — Other Ambulatory Visit: Payer: Self-pay

## 2019-05-13 DIAGNOSIS — I714 Abdominal aortic aneurysm, without rupture, unspecified: Secondary | ICD-10-CM

## 2019-05-13 HISTORY — DX: Essential (primary) hypertension: I10

## 2019-05-13 LAB — POCT I-STAT CREATININE: Creatinine, Ser: 0.9 mg/dL (ref 0.61–1.24)

## 2019-05-13 MED ORDER — IOHEXOL 350 MG/ML SOLN
100.0000 mL | Freq: Once | INTRAVENOUS | Status: AC | PRN
  Administered 2019-05-13: 08:00:00 100 mL via INTRAVENOUS

## 2019-05-20 ENCOUNTER — Encounter (INDEPENDENT_AMBULATORY_CARE_PROVIDER_SITE_OTHER): Payer: Self-pay | Admitting: Vascular Surgery

## 2019-05-20 ENCOUNTER — Ambulatory Visit (INDEPENDENT_AMBULATORY_CARE_PROVIDER_SITE_OTHER): Payer: Medicare Other | Admitting: Vascular Surgery

## 2019-05-20 ENCOUNTER — Other Ambulatory Visit: Payer: Self-pay

## 2019-05-20 VITALS — BP 157/78 | HR 51 | Resp 14 | Ht 64.0 in | Wt 176.0 lb

## 2019-05-20 DIAGNOSIS — I10 Essential (primary) hypertension: Secondary | ICD-10-CM

## 2019-05-20 DIAGNOSIS — I714 Abdominal aortic aneurysm, without rupture, unspecified: Secondary | ICD-10-CM

## 2019-05-20 DIAGNOSIS — E782 Mixed hyperlipidemia: Secondary | ICD-10-CM

## 2019-05-20 NOTE — Assessment & Plan Note (Signed)
blood pressure control important in reducing the progression of atherosclerotic disease. On appropriate oral medications.  

## 2019-05-20 NOTE — Patient Instructions (Signed)
Abdominal Aortic Aneurysm Endograft Repair  Abdominal aortic aneurysm endograft repair is a surgery to fix an aortic aneurysm in the abdominal area. An aneurysm is a weak or damaged part of an artery wall that bulges out from the normal force of blood pumping through the body. An abdominal aortic aneurysm is an aneurysm that happens in the lower part of the aorta, which is the main artery of the body. The repair is often done if the aneurysm gets so large that it might burst (rupture). A ruptured aneurysm would cause bleeding inside the body that could put a person's life in danger. Before that happens, this procedure is needed to fix the problem. The procedure may also be done if the aneurysm causes symptoms such as pain in the back, abdomen, or side. In this procedure, a tube made of fabric and metal mesh (endograft or stent-graft) is placed in the weak part of the aorta to repair it. Tell a health care provider about:  Any allergies you have.  All medicines you are taking, including vitamins, herbs, eye drops, creams, and over-the-counter medicines.  Any problems you or family members have had with anesthetic medicines.  Any blood disorders you have.  Any surgeries you have had.  Any medical conditions you have.  Whether you are pregnant or may be pregnant. What are the risks? Generally, this is a safe procedure. However, problems may occur, including:  Infection of the graft or incision area.  Bleeding during the procedure or from the incision site.  Allergic reactions to medicines.  Damage to other structures or organs.  Blood leaking out around the endograft.  The endograft moving from where it was placed during surgery.  Blood flow through the graft becoming blocked.  Blood clots.  Kidney problems.  Blood flow to the legs becoming blocked (rare).  Rupture of the aorta even after the endograft repair is a success (rare). What happens before the procedure? Staying  hydrated Follow instructions from your health care provider about hydration, which may include:  Up to 2 hours before the procedure - you may continue to drink clear liquids, such as water, clear fruit juice, black coffee, and plain tea. Eating and drinking restrictions Follow instructions from your health care provider about eating and drinking, which may include:  8 hours before the procedure - stop eating heavy meals or foods such as meat, fried foods, or fatty foods.  6 hours before the procedure - stop eating light meals or foods, such as toast or cereal.  6 hours before the procedure - stop drinking milk or drinks that contain milk.  2 hours before the procedure - stop drinking clear liquids. Medicines  Ask your health care provider about: ? Changing or stopping your regular medicines. This is especially important if you are taking diabetes medicines or blood thinners. ? Taking medicines such as aspirin and ibuprofen. These medicines can thin your blood. Do not take these medicines before your procedure if your health care provider instructs you not to.  You may be given antibiotic medicine to help prevent infection. General instructions  You may need to have blood tests, a test to check heart rhythm (electrocardiogram, or ECG), or a test to check blood flow (angiogram) before the surgery.  Imaging tests will be done to check the size and location of the aneurysm. These tests could include an ultrasound, a CT scan, or an MRI.  Do not use any products that contain nicotine or tobacco--such as cigarettes and e-cigarettes--for as   long as possible before the surgery. If you need help quitting, ask your health care provider.  Ask your health care provider how your surgical site will be marked or identified.  Plan to have someone take you home from the hospital or clinic. What happens during the procedure?  To reduce your risk of infection: ? Your health care team will wash or  sanitize their hands. ? Your skin will be washed with soap. ? Hair may be removed from the surgical area.  An IV tube will be inserted into one of your veins.  You will be given one or more of the following: ? A medicine to help you relax (sedative). ? A medicine to numb the area (local anesthetic). ? A medicine to make you fall asleep (general anesthetic). ? A medicine that is injected into an area of your body to numb everything below the injection site (regional anesthetic).  During the surgery: ? Small incisions or a puncture will be made on one or both sides of the groin. Long, thin tubes (catheters) will be passed through the opening, put into the artery in your thigh, and moved up into the aneurysm in the aorta. ? The health care provider will use live X-ray pictures to guide the endograft through the catheterto the place where the aneurysm is. ? The endograft will be released to seal off the aneurysm and to line the aorta. It will keep blood from flowing into the aneurysm and will help keep it from rupturing. The endograft will stay in place and will not be taken out. ? X-rays will be used to check where the endograft is placed and to make sure that it is where it should be. ? The catheter will be taken out, and the incision will be closed with stitches (sutures). The procedure may vary among health care providers and hospitals. What happens after the procedure?  Your blood pressure, heart rate, breathing rate, and blood oxygen level will be monitored until the medicines you were given have worn off.  You will need to lie flat for a number of hours. Bending your legs can cause them to bleed and swell.  You will then be urged to get up and move around a number of times each day and to slowly become more active.  You will be given medicines to control pain.  Certain tests may be done after your procedure to check how well the endograft is working and to check its placement.  Do  not drive for 24 hours if you received a sedative. This information is not intended to replace advice given to you by your health care provider. Make sure you discuss any questions you have with your health care provider. Document Revised: 03/30/2017 Document Reviewed: 07/12/2015 [employer] Patient Education  2020 [employer]  

## 2019-05-20 NOTE — Progress Notes (Signed)
MRN : [id number]  Derrick Jackson is a 86 y.o. ([birth year]) male who presents with chief complaint of  Chief Complaint  Patient presents with  . Follow-up    CT Review  .  History of Present Illness: Patient returns today in follow up of his AAA.  He remains asymptomatic. He has undergone a CTA that I have independently reviewed.  This demonstrates a 4.8 cm infrarenal abdominal aortic aneurysm with a reasonably good neck below the renal arteries.  His iliac and femoral arteries are generous without significant stenosis identified on the CT scan.  This clearly represents almost a centimeter of growth in the past year and an aneurysm now approaching 5 cm in maximal diameter.  Current Outpatient Medications  Medication Sig Dispense Refill  . amLODipine (NORVASC) 2.5 MG tablet     . aspirin EC 81 MG tablet Take by mouth.    Marland Kitchen atorvastatin (LIPITOR) 40 MG tablet     . ramipril (ALTACE) 2.5 MG capsule Take 2.5 mg by mouth daily.     No current facility-administered medications for this visit.    Past Medical History:  Diagnosis Date  . AAA (abdominal aortic aneurysm) ([place])   . Hx of basal cell carcinoma 08/29/2017   R infrascapular back, R distal lat. bicep near elbow  . Hyperlipidemia   . Hypertension     Past Surgical History:  Procedure Laterality Date  . CARDIAC SURGERY       Social History   Tobacco Use  . Smoking status: Never Smoker  . Smokeless tobacco: Never Used  Substance Use Topics  . Alcohol use: No  . Drug use: No     Family History  Problem Relation Age of Onset  . Heart disease Mother   . Heart disease Father   no bleeding or clotting disorders  Allergies  Allergen Reactions  . Penicillins Hives  . Tetanus Antitoxin   . Tetanus Toxoids Other (See Comments)    Chest pain     REVIEW OF SYSTEMS (Negative unless checked)  Constitutional: [] Weight loss  [] Fever  [] Chills Cardiac: [] Chest pain   [] Chest pressure   [] Palpitations   [] Shortness  of breath when laying flat   [] Shortness of breath at rest   [] Shortness of breath with exertion. Vascular:  [] Pain in legs with walking   [] Pain in legs at rest   [] Pain in legs when laying flat   [] Claudication   [] Pain in feet when walking  [] Pain in feet at rest  [] Pain in feet when laying flat   [] History of DVT   [] Phlebitis   [] Swelling in legs   [] Varicose veins   [] Non-healing ulcers Pulmonary:   [] Uses home oxygen   [] Productive cough   [] Hemoptysis   [] Wheeze  [] COPD   [] Asthma Neurologic:  [] Dizziness  [] Blackouts   [] Seizures   [] History of stroke   [] History of TIA  [] Aphasia   [] Temporary blindness   [] Dysphagia   [] Weakness or numbness in arms   [] Weakness or numbness in legs Musculoskeletal:  [x] Arthritis   [] Joint swelling   [] Joint pain   [x] Low back pain Hematologic:  [] Easy bruising  [] Easy bleeding   [] Hypercoagulable state   [] Anemic   Gastrointestinal:  [] Blood in stool   [] Vomiting blood  [] Gastroesophageal reflux/heartburn   [] Abdominal pain Genitourinary:  [] Chronic kidney disease   [] Difficult urination  [] Frequent urination  [] Burning with urination   [] Hematuria Skin:  [] Rashes   [] Ulcers   [] Wounds Psychological:  [] History of  anxiety   []  History of major depression.  Physical Examination  BP (!) 157/78 (BP Location: Right Arm)   Pulse (!) 51   Resp 14   Ht 5\' 4"  (1.626 m)   Wt 176 lb (79.8 kg)   BMI 30.21 kg/m  Gen:  WD/WN, NAD. Appears younger than stated age. Head: [place]/AT, No temporalis wasting. Ear/Nose/Throat: Hearing grossly intact, nares w/o erythema or drainage Eyes: Conjunctiva clear. Sclera non-icteric Neck: Supple.  Trachea midline Pulmonary:  Good air movement, no use of accessory muscles.  Cardiac: RRR, no JVD Vascular:  Vessel Right Left  Radial Palpable Palpable                          PT Palpable Palpable  DP Palpable Palpable   Gastrointestinal: soft, non-tender/non-distended. Increased aortic impulse  Musculoskeletal: M/S 5/5  throughout.  No deformity or atrophy. No edema. Neurologic: Sensation grossly intact in extremities.  Symmetrical.  Speech is fluent.  Psychiatric: Judgment intact, Mood & affect appropriate for pt's clinical situation. Dermatologic: No rashes or ulcers noted.  No cellulitis or open wounds.       Labs Recent Results (from the past 2160 hour(s))  I-STAT creatinine     Status: None   Collection Time: 05/13/19  8:14 AM  Result Value Ref Range   Creatinine, Ser 0.90 0.61 - 1.24 mg/dL    Radiology AAA Duplex  Result Date: 04/22/2019 ABDOMINAL AORTA STUDY Indications: Follow up exam for known AAA.  Comparison Study: 04/19/2018 Performing Technologist: Almira Coaster RVS  Examination Guidelines: A complete evaluation includes B-mode imaging, spectral Doppler, color Doppler, and power Doppler as needed of all accessible portions of each vessel. Bilateral testing is considered an integral part of a complete examination. Limited examinations for reoccurring indications may be performed as noted.  Abdominal Aorta Findings: +-----------+-------+----------+----------+----------+--------+--------+ Location   AP (cm)Trans (cm)PSV (cm/s)Waveform  ThrombusComments +-----------+-------+----------+----------+----------+--------+--------+ Proximal   2.30   2.66      72        monophasic                 +-----------+-------+----------+----------+----------+--------+--------+ Mid        2.36   2.33      86        monophasic                 +-----------+-------+----------+----------+----------+--------+--------+ Distal     4.80   4.86      74        monophasic                 +-----------+-------+----------+----------+----------+--------+--------+ RT CIA Prox2.1    2.3       99        biphasic                   +-----------+-------+----------+----------+----------+--------+--------+ LT CIA Prox1.8    1.8       104       triphasic                   +-----------+-------+----------+----------+----------+--------+--------+  Summary: Abdominal Aorta: There is evidence of abnormal dilatation of the proximal, mid and distal Abdominal aorta. There is evidence of abnormal dilation of the Right Common Iliac artery and Left Common Iliac artery. The largest aortic measurement is 4.9 cm. The  largest aortic diameter has increased compared to prior exam. Previous diameter measurement was 4.0 cm obtained on 04/20/2019.  *See table(s) above for measurements and  observations.  Electronically signed by Leotis Pain MD on 04/22/2019 at 8:43:07 AM.   Final    CT Angio Abd/Pel w/ and/or w/o  Result Date: 05/13/2019 CLINICAL DATA:  Preop, abdominal aortic aneurysm, currently asymptomatic EXAM: CTA ABDOMEN AND PELVIS WITH CONTRAST TECHNIQUE: Multidetector CT imaging of the abdomen and pelvis was performed using the standard protocol during bolus administration of intravenous contrast. Multiplanar reconstructed images and MIPs were obtained and reviewed to evaluate the vascular anatomy. CONTRAST:  195mL OMNIPAQUE IOHEXOL 350 MG/ML SOLN COMPARISON:  11/26/2015 FINDINGS: VASCULAR Coronary calcifications. Aorta: Mild scattered calcified plaque in the visualized distal descending thoracic and suprarenal segments. Fusiform dilatation of the infrarenal aorta 4.8 x 3.7 cm maximum transverse dimensions (previously 4.1 x 3.3 by my measurement), tapering to normal caliber above the bifurcation. There is a small amount of infrarenal eccentric nonocclusive mural thrombus. Displaced calcified intimal calcifications at the distal margin suggest penetrating atheromatous ulcer or dissection as possible etiology. No stenosis. Celiac: Patent without evidence of aneurysm, dissection, vasculitis or significant stenosis. SMA: Patent without evidence of aneurysm, dissection, vasculitis or significant stenosis. Renals: Both renal arteries are patent without evidence of aneurysm, dissection, vasculitis,  fibromuscular dysplasia or significant stenosis. IMA: Patent, arising at the inferior margin of the aneurysmal segment. Inflow: On the right,Mild scattered calcified plaque. No aneurysm, dissection, or stenosis. On the left, mild fusiform dilatation of common iliac up to 1.5 cm diameter with some eccentric nonocclusive mural thrombus. Remainder of the iliac arterial system unremarkable. Proximal Outflow: Bilateral common femoral and visualized portions of the superficial and profunda femoral arteries are patent without evidence of aneurysm, dissection, vasculitis or significant stenosis. Veins: Patent hepatic veins, portal vein, SMV, splenic vein, bilateral renal veins, IVC. No venous pathology identified. Review of the MIP images confirms the above findings. NON-VASCULAR Lower chest: Previous median sternotomy. No pleural or pericardial effusion. Hepatobiliary: Cholecystectomy clips. No focal liver lesion or biliary ductal dilatation. Pancreas: Unremarkable. No pancreatic ductal dilatation or surrounding inflammatory changes. Spleen: Normal in size without focal abnormality. Adrenals/Urinary Tract: Adrenal glands unremarkable. Bilateral exophytic renal cysts as before. No hydronephrosis. Urinary bladder is incompletely distended. Stomach/Bowel: Stomach is nondistended. Small bowel decompressed. Normal appendix. Scattered colonic diverticula without significant adjacent inflammatory/edematous change or abscess. Lymphatic: No abdominal or pelvic adenopathy. Reproductive: Mild prostate enlargement. Other: No ascites.  No free air. Musculoskeletal: Small ventral hernias containing only mesenteric fat. Mild DJD in the lower lumbar spine. Sternotomy wires. No fracture or worrisome bone lesion. IMPRESSION: 1. 4.8 cm infrarenal abdominal aortic aneurysm, previously 4.1 cm in 2017. Recommend followup by abdomen and pelvis CTA in 6 months, and vascular surgery referral/consultation if not already obtained. This  recommendation follows ACR consensus guidelines: White Paper of the ACR Incidental Findings Committee II on Vascular Findings. J Am Coll Radiol 2013; 10:789-794. 2. Mild 1.5 cm fusiform dilatation of left common iliac artery. 3. Colonic diverticulosis. Electronically Signed   By: Lucrezia Europe M.D.   On: 05/13/2019 14:31    Assessment/Plan Hyperlipidemia lipid control important in reducing the progression of atherosclerotic disease. Continue statin therapy  Benign essential hypertension blood pressure control important in reducing the progression of atherosclerotic disease. On appropriate oral medications.   AAA (abdominal aortic aneurysm) without rupture ([place]) He has undergone a CTA that I have independently reviewed.  This demonstrates a 4.8 cm infrarenal abdominal aortic aneurysm with a reasonably good neck below the renal arteries.  His iliac and femoral arteries are generous without significant stenosis identified on the CT scan.  This clearly represents almost a centimeter of growth in the past year and an aneurysm now approaching 5 cm in maximal diameter. Given the fairly rapid growth with an aneurysm approaching 5 cm in maximal diameter, repair is indicated.  He does have favorable anatomy for a stent graft repair and this would be our plan of repair.  I have discussed the procedure in detail with the patient.  I discussed the risks and benefits.  Patient is wife are agreeable to proceed and this to be scheduled in the near future at his convenience.    Leotis Pain, MD  05/20/2019 2:02 PM    This note was created with Dragon medical transcription system.  Any errors from dictation are purely unintentional

## 2019-05-20 NOTE — Assessment & Plan Note (Signed)
He has undergone a CTA that I have independently reviewed.  This demonstrates a 4.8 cm infrarenal abdominal aortic aneurysm with a reasonably good neck below the renal arteries.  His iliac and femoral arteries are generous without significant stenosis identified on the CT scan.  This clearly represents almost a centimeter of growth in the past year and an aneurysm now approaching 5 cm in maximal diameter. Given the fairly rapid growth with an aneurysm approaching 5 cm in maximal diameter, repair is indicated.  He does have favorable anatomy for a stent graft repair and this would be our plan of repair.  I have discussed the procedure in detail with the patient.  I discussed the risks and benefits.  Patient is wife are agreeable to proceed and this to be scheduled in the near future at his convenience.

## 2019-05-26 ENCOUNTER — Encounter (INDEPENDENT_AMBULATORY_CARE_PROVIDER_SITE_OTHER): Payer: Self-pay

## 2019-06-05 ENCOUNTER — Encounter (INDEPENDENT_AMBULATORY_CARE_PROVIDER_SITE_OTHER): Payer: Self-pay

## 2019-06-12 ENCOUNTER — Telehealth (INDEPENDENT_AMBULATORY_CARE_PROVIDER_SITE_OTHER): Payer: Self-pay

## 2019-06-12 NOTE — Telephone Encounter (Signed)
Spoke with the patient's wife and he is now scheduled for a AAA repair with Dr. Lucky Cowboy on 06/25/19. Patient will have a phone call pre-op on 06/19/19 between 8-1 pm and covid testing on 06/23/19 between 12:30-2:30 pm at the [facility]. Pre-surgical instructions will be mailed to the patient.

## 2019-06-18 ENCOUNTER — Other Ambulatory Visit (INDEPENDENT_AMBULATORY_CARE_PROVIDER_SITE_OTHER): Payer: Self-pay | Admitting: Nurse Practitioner

## 2019-06-18 ENCOUNTER — Other Ambulatory Visit (INDEPENDENT_AMBULATORY_CARE_PROVIDER_SITE_OTHER): Payer: Self-pay | Admitting: Vascular Surgery

## 2019-06-19 ENCOUNTER — Encounter: Payer: Self-pay | Admitting: Vascular Surgery

## 2019-06-19 ENCOUNTER — Encounter
Admission: RE | Admit: 2019-06-19 | Discharge: 2019-06-19 | Disposition: A | Discharge status: Home / Self Care | Payer: Medicare Other | Source: Ambulatory Visit | Attending: Vascular Surgery | Admitting: Vascular Surgery

## 2019-06-19 ENCOUNTER — Other Ambulatory Visit: Payer: Self-pay

## 2019-06-19 NOTE — Pre-Procedure Instructions (Signed)
COPIED FROM [facility]  Imaging Results - documented in this encounter  NM myocardial perfusion SPECT multiple (stress and rest) (06/06/2019 1:32 PM EST) NM myocardial perfusion SPECT multiple (stress and rest) (06/06/2019 1:32 PM EST)  Specimen     NM myocardial perfusion SPECT multiple (stress and rest) (06/06/2019 1:32 PM EST)  Impressions Performed At  Normal treadmill EKG without evidence of ischemia or arrhythmia  Normal myocardial perfusion without evidence of myocardial ischemia    [facility]      NM myocardial perfusion SPECT multiple (stress and rest) (06/06/2019 1:32 PM EST)  Narrative Performed At  [facility]  [place], [place], [place] [address]  [phone]    Procedure: Exercise Myocardial Perfusion Imaging   ONE day procedure    Indication: Acute chest pain, unspecified  Plan: NM myocardial perfusion SPECT multiple (stress     and rest), ECG stress test only    SOB (shortness of breath)  Plan: NM myocardial perfusion SPECT multiple (stress     and rest), ECG stress test only    Ordering Physician:     Dr. Serafina Royals      Clinical History:  86 y.o. year old male  Vitals: Height: 70 in Weight: 171 lb  Cardiac risk factors include:     AAA, Hyperlipidemia, HTN, CABG and CAD       Procedure:  The patient performed treadmill exercise using a Bruce protocol for 7:00   minutes. The exercise test was stopped due to fatigue. Blood pressure   response was normal. The patient developed symptoms other than fatigue   during the procedure; specific symptoms included SOB    Rest HR: 82bpm  Rest BP: 132/39mmHg  Max HR: 157bpm  Max BP: 176/37mmHg  Mets:   8.50  % MAX HR:  98%    Stress Test Administered by: Oswald Hillock, CMA    ECG Interpretation:  Rest ECG: normal sinus rhythm, none  Stress ECG: normal sinus  rhythm,   Recovery ECG: normal sinus rhythm  ECG Interpretation: negative, no ECG changes.      Administrations This Visit   technetium Tc40m sestamibi (CARDIOLITE) injection [id number] millicurie   Admin Date  06/06/2019 Action  Given Dose  [id number] millicurie Route  Intravenous Administered By  Herbert Seta, CNMT       technetium Tc29m sestamibi (CARDIOLITE) injection [id number] millicurie   Admin Date  06/06/2019 Action  Given Dose  [id number] millicurie Route  Intravenous Administered By  Herbert Seta, CNMT             Gated post-stress perfusion imaging was performed 30 minutes after stress.   Rest images were performed 30 minutes after injection.    Gated LV Analysis:     Summary of LV Perfusion: Normal    Summary of LV Function: Normal    TID: 0.9    LVEF= 63%    FINDINGS:  Regional wall motion: reveals normal myocardial thickening and wall   motion.  The overall quality of the study is good.   Artifacts noted: no  Left ventricular cavity: normal.    Perfusion Analysis: SPECT images demonstrate homogeneous tracer   distribution throughout the myocardium. Defect type: Normal

## 2019-06-19 NOTE — Patient Instructions (Signed)
Your procedure is scheduled on: 06/25/19 Report to [facility]. To find out your arrival time please call [phone] between 1PM - 3PM on 06/24/19.  Remember: Instructions that are not followed completely may result in serious medical risk, up to and including death, or upon the discretion of your surgeon and anesthesiologist your surgery may need to be rescheduled.     _X__ 1. Do not eat food after midnight the night before your procedure.                 No gum chewing or hard candies. You may drink clear liquids up to 2 hours                 before you are scheduled to arrive for your surgery- DO not drink clear                 liquids within 2 hours of the start of your surgery.                 Clear Liquids include:  water, apple juice without pulp, clear carbohydrate                 drink such as Clearfast or Gatorade, Black Coffee or Tea (Do not add                 anything to coffee or tea). Diabetics water only  __X__2.  On the morning of surgery brush your teeth with toothpaste and water, you                 may rinse your mouth with mouthwash if you wish.  Do not swallow any              toothpaste of mouthwash.     _X__ 3.  No Alcohol for 24 hours before or after surgery.   _X__ 4.  Do Not Smoke or use e-cigarettes For 24 Hours Prior to Your Surgery.                 Do not use any chewable tobacco products for at least 6 hours prior to                 surgery.  ____  5.  Bring all medications with you on the day of surgery if instructed.   __X__  6.  Notify your doctor if there is any change in your medical condition      (cold, fever, infections).     Do not wear jewelry, make-up, hairpins, clips or nail polish. Do not wear lotions, powders, or perfumes.  Do not shave 48 hours prior to surgery. Men may shave face and neck. Do not bring valuables to the hospital.    [facility] is not responsible for any belongings or  valuables.  Contacts, dentures/partials or body piercings may not be worn into surgery. Bring a case for your contacts, glasses or hearing aids, a denture cup will be supplied. Leave your suitcase in the car. After surgery it may be brought to your room. For patients admitted to the hospital, discharge time is determined by your treatment team.   Patients discharged the day of surgery will not be allowed to drive home.   Please read over the following fact sheets that you were given:   MRSA Information  __X__ Take these medicines the morning of surgery with A SIP OF WATER:  1. NONE  2.   3.   4.  5.  6.  ____ Fleet Enema (as directed)   __X__ Use CHG Soap/SAGE wipes as directed  ____ Use inhalers on the day of surgery  ____ Stop metformin/Janumet/Farxiga 2 days prior to surgery    ____ Take 1/2 of usual insulin dose the night before surgery. No insulin the morning          of surgery.   ____ Stop Blood Thinners Coumadin/Plavix/Xarelto/Pleta/Pradaxa/Eliquis/Effient/Aspirin  on   Or contact your Surgeon, Cardiologist or Medical Doctor regarding  ability to stop your blood thinners  __X__ Stop Anti-inflammatories 7 days before surgery such as Advil, Ibuprofen, Motrin,  BC or Goodies Powder, Naprosyn, Naproxen, Aleve   __X__ Stop all herbal supplements, fish oil or vitamin E until after surgery.    ____ Bring C-Pap to the hospital.    THE EVENING BEFORE YOUR SURGERY TAKE YOUR MEDICATIONS AS USUAL EXCEPT FOR THE ASPIRIN.

## 2019-06-23 ENCOUNTER — Other Ambulatory Visit: Payer: Self-pay

## 2019-06-23 ENCOUNTER — Other Ambulatory Visit
Admission: RE | Admit: 2019-06-23 | Discharge: 2019-06-23 | Disposition: A | Discharge status: Home / Self Care | Payer: Medicare Other | Source: Ambulatory Visit | Attending: Vascular Surgery | Admitting: Vascular Surgery

## 2019-06-23 LAB — CBC WITH DIFFERENTIAL/PLATELET
Abs Immature Granulocytes: 0.01 10*3/uL (ref 0.00–0.07)
Basophils Absolute: 0 10*3/uL (ref 0.0–0.1)
Basophils Relative: 0 %
Eosinophils Absolute: 0.1 10*3/uL (ref 0.0–0.5)
Eosinophils Relative: 2 %
HCT: 41.3 % (ref 39.0–52.0)
Hemoglobin: 13.8 g/dL (ref 13.0–17.0)
Immature Granulocytes: 0 %
Lymphocytes Relative: 39 %
Lymphs Abs: 2.8 10*3/uL (ref 0.7–4.0)
MCH: 31.7 pg (ref 26.0–34.0)
MCHC: 33.4 g/dL (ref 30.0–36.0)
MCV: 94.7 fL (ref 80.0–100.0)
Monocytes Absolute: 0.5 10*3/uL (ref 0.1–1.0)
Monocytes Relative: 7 %
Neutro Abs: 3.8 10*3/uL (ref 1.7–7.7)
Neutrophils Relative %: 52 %
Platelets: 149 10*3/uL — ABNORMAL LOW (ref 150–400)
RBC: 4.36 MIL/uL (ref 4.22–5.81)
RDW: 13.6 % (ref 11.5–15.5)
WBC: 7.3 10*3/uL (ref 4.0–10.5)
nRBC: 0 % (ref 0.0–0.2)

## 2019-06-23 LAB — BASIC METABOLIC PANEL
Anion gap: 6 (ref 5–15)
BUN: 18 mg/dL (ref 8–23)
CO2: 29 mmol/L (ref 22–32)
Calcium: 9.1 mg/dL (ref 8.9–10.3)
Chloride: 107 mmol/L (ref 98–111)
Creatinine, Ser: 0.9 mg/dL (ref 0.61–1.24)
GFR calc Af Amer: >60 mL/min (ref >60)
GFR calc non Af Amer: >60 mL/min (ref >60)
Glucose, Bld: 110 mg/dL — ABNORMAL HIGH (ref 70–99)
Potassium: 4.2 mmol/L (ref 3.5–5.1)
Sodium: 142 mmol/L (ref 135–145)

## 2019-06-23 LAB — TYPE AND SCREEN
ABO/RH(D): A POS
Antibody Screen: NEGATIVE

## 2019-06-23 LAB — APTT: aPTT: 30 seconds (ref 24–36)

## 2019-06-23 LAB — PROTIME-INR
INR: 1 (ref 0.8–1.2)
Prothrombin Time: 12.7 seconds (ref 11.4–15.2)

## 2019-06-24 LAB — SARS CORONAVIRUS 2 (TAT 6-24 HRS): SARS Coronavirus 2: NEGATIVE

## 2019-06-25 ENCOUNTER — Inpatient Hospital Stay: Payer: Medicare Other

## 2019-06-25 ENCOUNTER — Encounter
Admission: RE | Disposition: A | Discharge status: Home / Self Care | Payer: Self-pay | Source: Home / Self Care | Attending: Vascular Surgery

## 2019-06-25 ENCOUNTER — Encounter: Payer: Self-pay | Admitting: Anesthesiology

## 2019-06-25 ENCOUNTER — Encounter: Payer: Self-pay | Admitting: Vascular Surgery

## 2019-06-25 ENCOUNTER — Inpatient Hospital Stay
Admission: RE | Admit: 2019-06-25 | Discharge: 2019-06-26 | DRG: 269 | Disposition: A | Discharge status: Home / Self Care | Payer: Medicare Other | Attending: Vascular Surgery | Admitting: Vascular Surgery

## 2019-06-25 ENCOUNTER — Other Ambulatory Visit: Payer: Self-pay

## 2019-06-25 DIAGNOSIS — I2510 Atherosclerotic heart disease of native coronary artery without angina pectoris: Secondary | ICD-10-CM | POA: Diagnosis present

## 2019-06-25 DIAGNOSIS — Z9049 Acquired absence of other specified parts of digestive tract: Secondary | ICD-10-CM | POA: Diagnosis not present

## 2019-06-25 DIAGNOSIS — Z20822 Contact with and (suspected) exposure to covid-19: Secondary | ICD-10-CM | POA: Diagnosis present

## 2019-06-25 DIAGNOSIS — Z951 Presence of aortocoronary bypass graft: Secondary | ICD-10-CM | POA: Diagnosis not present

## 2019-06-25 DIAGNOSIS — I714 Abdominal aortic aneurysm, without rupture, unspecified: Secondary | ICD-10-CM | POA: Diagnosis present

## 2019-06-25 DIAGNOSIS — E782 Mixed hyperlipidemia: Secondary | ICD-10-CM | POA: Diagnosis not present

## 2019-06-25 DIAGNOSIS — E785 Hyperlipidemia, unspecified: Secondary | ICD-10-CM | POA: Diagnosis present

## 2019-06-25 DIAGNOSIS — Z8249 Family history of ischemic heart disease and other diseases of the circulatory system: Secondary | ICD-10-CM

## 2019-06-25 DIAGNOSIS — Z888 Allergy status to other drugs, medicaments and biological substances status: Secondary | ICD-10-CM

## 2019-06-25 DIAGNOSIS — Z85828 Personal history of other malignant neoplasm of skin: Secondary | ICD-10-CM

## 2019-06-25 DIAGNOSIS — Z880 Allergy status to penicillin: Secondary | ICD-10-CM | POA: Diagnosis not present

## 2019-06-25 DIAGNOSIS — I10 Essential (primary) hypertension: Secondary | ICD-10-CM | POA: Diagnosis present

## 2019-06-25 HISTORY — PX: ABDOMINAL AORTIC ENDOVASCULAR STENT GRAFT: SHX5707

## 2019-06-25 HISTORY — PX: ENDOVASCULAR STENT GRAFT (AAA): CATH118280

## 2019-06-25 HISTORY — PX: ENDOVASCULAR REPAIR/STENT GRAFT: CATH118280

## 2019-06-25 LAB — ABO/RH: ABO/RH(D): A POS

## 2019-06-25 LAB — MRSA PCR SCREENING: MRSA by PCR: NEGATIVE

## 2019-06-25 LAB — GLUCOSE, CAPILLARY: Glucose-Capillary: 102 mg/dL — ABNORMAL HIGH (ref 70–99)

## 2019-06-25 SURGERY — INSERTION, ENDOVASCULAR STENT GRAFT, AORTA, ABDOMINAL
Anesthesia: General

## 2019-06-25 SURGERY — ENDOVASCULAR STENT GRAFT (AAA)
Anesthesia: General

## 2019-06-25 MED ORDER — DOPAMINE-DEXTROSE 3.2-5 MG/ML-% IV SOLN: 3.0000 ug/kg/min | INTRAVENOUS | Status: DC

## 2019-06-25 MED ORDER — ONDANSETRON HCL 4 MG/2ML IJ SOLN: 4.0000 mg | Freq: Four times a day (QID) | INTRAMUSCULAR | Status: DC | PRN

## 2019-06-25 MED ORDER — SODIUM CHLORIDE 0.9 % IV SOLN
INTRAVENOUS | Status: DC | PRN
  Administered 2019-06-25: 40 ug/min via INTRAVENOUS

## 2019-06-25 MED ORDER — FENTANYL CITRATE (PF) 100 MCG/2ML IJ SOLN: 25.0000 ug | INTRAMUSCULAR | Status: DC | PRN

## 2019-06-25 MED ORDER — ASPIRIN EC 81 MG PO TBEC
81.0000 mg | DELAYED_RELEASE_TABLET | Freq: Every day | ORAL | Status: DC
  Administered 2019-06-25 – 2019-06-26 (×2): 81 mg via ORAL
  Filled 2019-06-25 (×3): qty 1

## 2019-06-25 MED ORDER — SUGAMMADEX SODIUM 200 MG/2ML IV SOLN
INTRAVENOUS | Status: AC
  Filled 2019-06-25: qty 2

## 2019-06-25 MED ORDER — ACETAMINOPHEN 325 MG PO TABS: 325.0000 mg | ORAL_TABLET | ORAL | Status: DC | PRN

## 2019-06-25 MED ORDER — CLINDAMYCIN PHOSPHATE 300 MG/50ML IV SOLN
300.0000 mg | INTRAVENOUS | Status: AC
  Administered 2019-06-25: 300 mg via INTRAVENOUS

## 2019-06-25 MED ORDER — MORPHINE SULFATE (PF) 2 MG/ML IV SOLN: 2.0000 mg | INTRAVENOUS | Status: DC | PRN

## 2019-06-25 MED ORDER — DEXAMETHASONE SODIUM PHOSPHATE 10 MG/ML IJ SOLN
INTRAMUSCULAR | Status: AC
  Filled 2019-06-25: qty 1

## 2019-06-25 MED ORDER — FENTANYL CITRATE (PF) 100 MCG/2ML IJ SOLN
INTRAMUSCULAR | Status: DC | PRN
  Administered 2019-06-25: 50 ug via INTRAVENOUS
  Administered 2019-06-25: 25 ug via INTRAVENOUS

## 2019-06-25 MED ORDER — HYDROMORPHONE HCL 1 MG/ML IJ SOLN: 1.0000 mg | Freq: Once | INTRAMUSCULAR | Status: DC | PRN

## 2019-06-25 MED ORDER — LACTATED RINGERS IV SOLN: INTRAVENOUS | Status: DC

## 2019-06-25 MED ORDER — CHLORHEXIDINE GLUCONATE CLOTH 2 % EX PADS
6.0000 | MEDICATED_PAD | Freq: Once | CUTANEOUS | Status: AC
  Administered 2019-06-25: 6 via TOPICAL

## 2019-06-25 MED ORDER — ROCURONIUM BROMIDE 50 MG/5ML IV SOLN
INTRAVENOUS | Status: AC
  Filled 2019-06-25: qty 1

## 2019-06-25 MED ORDER — ALUM & MAG HYDROXIDE-SIMETH 200-200-20 MG/5ML PO SUSP: 15.0000 mL | ORAL | Status: DC | PRN

## 2019-06-25 MED ORDER — FAMOTIDINE IN NACL 20-0.9 MG/50ML-% IV SOLN
20.0000 mg | Freq: Two times a day (BID) | INTRAVENOUS | Status: DC
  Administered 2019-06-25 – 2019-06-26 (×3): 20 mg via INTRAVENOUS
  Filled 2019-06-25 (×3): qty 50

## 2019-06-25 MED ORDER — NITROGLYCERIN IN D5W 200-5 MCG/ML-% IV SOLN: 5.0000 ug/min | INTRAVENOUS | Status: DC

## 2019-06-25 MED ORDER — GLYCOPYRROLATE 0.2 MG/ML IJ SOLN
INTRAMUSCULAR | Status: DC | PRN
  Administered 2019-06-25: .2 mg via INTRAVENOUS

## 2019-06-25 MED ORDER — FAMOTIDINE 20 MG PO TABS: 20.0000 mg | ORAL_TABLET | Freq: Once | ORAL | Status: DC

## 2019-06-25 MED ORDER — OXYCODONE-ACETAMINOPHEN 5-325 MG PO TABS: 1.0000 | ORAL_TABLET | ORAL | Status: DC | PRN

## 2019-06-25 MED ORDER — SODIUM CHLORIDE 0.9 % IV SOLN: INTRAVENOUS | Status: DC

## 2019-06-25 MED ORDER — LIDOCAINE HCL (PF) 2 % IJ SOLN
INTRAMUSCULAR | Status: AC
  Filled 2019-06-25: qty 5

## 2019-06-25 MED ORDER — PHENOL 1.4 % MT LIQD
1.0000 | OROMUCOSAL | Status: DC | PRN
  Filled 2019-06-25: qty 177

## 2019-06-25 MED ORDER — SODIUM CHLORIDE 0.9 % IV SOLN: 500.0000 mL | Freq: Once | INTRAVENOUS | Status: DC | PRN

## 2019-06-25 MED ORDER — ATORVASTATIN CALCIUM 20 MG PO TABS
40.0000 mg | ORAL_TABLET | Freq: Every day | ORAL | Status: DC
  Administered 2019-06-25 – 2019-06-26 (×2): 40 mg via ORAL
  Filled 2019-06-25 (×2): qty 2

## 2019-06-25 MED ORDER — HYDRALAZINE HCL 20 MG/ML IJ SOLN: 5.0000 mg | INTRAMUSCULAR | Status: DC | PRN

## 2019-06-25 MED ORDER — ONDANSETRON HCL 4 MG/2ML IJ SOLN
4.0000 mg | Freq: Four times a day (QID) | INTRAMUSCULAR | Status: DC | PRN
  Administered 2019-06-25: 4 mg via INTRAVENOUS

## 2019-06-25 MED ORDER — POTASSIUM CHLORIDE CRYS ER 20 MEQ PO TBCR: 20.0000 meq | EXTENDED_RELEASE_TABLET | Freq: Every day | ORAL | Status: DC | PRN

## 2019-06-25 MED ORDER — DOCUSATE SODIUM 100 MG PO CAPS
100.0000 mg | ORAL_CAPSULE | Freq: Every day | ORAL | Status: DC
  Administered 2019-06-26: 100 mg via ORAL
  Filled 2019-06-25: qty 1

## 2019-06-25 MED ORDER — LABETALOL HCL 5 MG/ML IV SOLN: 10.0000 mg | INTRAVENOUS | Status: DC | PRN

## 2019-06-25 MED ORDER — RAMIPRIL 2.5 MG PO CAPS
2.5000 mg | ORAL_CAPSULE | Freq: Every day | ORAL | Status: DC
  Administered 2019-06-25 – 2019-06-26 (×2): 2.5 mg via ORAL
  Filled 2019-06-25 (×2): qty 1

## 2019-06-25 MED ORDER — PHENYLEPHRINE HCL (PRESSORS) 10 MG/ML IV SOLN
INTRAVENOUS | Status: DC | PRN
  Administered 2019-06-25: 100 ug via INTRAVENOUS

## 2019-06-25 MED ORDER — CLINDAMYCIN PHOSPHATE 300 MG/50ML IV SOLN
INTRAVENOUS | Status: AC
  Filled 2019-06-25: qty 50

## 2019-06-25 MED ORDER — HEPARIN SODIUM (PORCINE) 1000 UNIT/ML IJ SOLN
INTRAMUSCULAR | Status: DC | PRN
  Administered 2019-06-25: 5000 [IU] via INTRAVENOUS

## 2019-06-25 MED ORDER — ONDANSETRON HCL 4 MG/2ML IJ SOLN
INTRAMUSCULAR | Status: AC
  Filled 2019-06-25: qty 2

## 2019-06-25 MED ORDER — DEXAMETHASONE SODIUM PHOSPHATE 10 MG/ML IJ SOLN
INTRAMUSCULAR | Status: DC | PRN
  Administered 2019-06-25: 10 mg via INTRAVENOUS

## 2019-06-25 MED ORDER — ROCURONIUM BROMIDE 100 MG/10ML IV SOLN
INTRAVENOUS | Status: DC | PRN
  Administered 2019-06-25: 50 mg via INTRAVENOUS
  Administered 2019-06-25: 10 mg via INTRAVENOUS

## 2019-06-25 MED ORDER — AMLODIPINE BESYLATE 5 MG PO TABS
2.5000 mg | ORAL_TABLET | Freq: Every day | ORAL | Status: DC
  Administered 2019-06-25 – 2019-06-26 (×2): 2.5 mg via ORAL
  Filled 2019-06-25 (×2): qty 1

## 2019-06-25 MED ORDER — MAGNESIUM SULFATE 2 GM/50ML IV SOLN: 2.0000 g | Freq: Every day | INTRAVENOUS | Status: DC | PRN

## 2019-06-25 MED ORDER — OXYCODONE HCL 5 MG/5ML PO SOLN: 5.0000 mg | Freq: Once | ORAL | Status: DC | PRN

## 2019-06-25 MED ORDER — SUGAMMADEX SODIUM 200 MG/2ML IV SOLN
INTRAVENOUS | Status: DC | PRN
  Administered 2019-06-25: 200 mg via INTRAVENOUS

## 2019-06-25 MED ORDER — FENTANYL CITRATE (PF) 100 MCG/2ML IJ SOLN
INTRAMUSCULAR | Status: AC
  Filled 2019-06-25: qty 2

## 2019-06-25 MED ORDER — GUAIFENESIN-DM 100-10 MG/5ML PO SYRP: 15.0000 mL | ORAL_SOLUTION | ORAL | Status: DC | PRN

## 2019-06-25 MED ORDER — PROPOFOL 10 MG/ML IV BOLUS
INTRAVENOUS | Status: AC
  Filled 2019-06-25: qty 20

## 2019-06-25 MED ORDER — ACETAMINOPHEN 650 MG RE SUPP: 325.0000 mg | RECTAL | Status: DC | PRN

## 2019-06-25 MED ORDER — SODIUM CHLORIDE 0.9 % IV SOLN: INTRAVENOUS | Status: DC | PRN

## 2019-06-25 MED ORDER — IODIXANOL 320 MG/ML IV SOLN
INTRAVENOUS | Status: DC | PRN
  Administered 2019-06-25: 50 mL

## 2019-06-25 MED ORDER — GLYCOPYRROLATE 0.2 MG/ML IJ SOLN
INTRAMUSCULAR | Status: AC
  Filled 2019-06-25: qty 1

## 2019-06-25 MED ORDER — PROPOFOL 10 MG/ML IV BOLUS
INTRAVENOUS | Status: DC | PRN
  Administered 2019-06-25: 30 mg via INTRAVENOUS
  Administered 2019-06-25: 100 mg via INTRAVENOUS

## 2019-06-25 MED ORDER — LIDOCAINE HCL (CARDIAC) PF 100 MG/5ML IV SOSY
PREFILLED_SYRINGE | INTRAVENOUS | Status: DC | PRN
  Administered 2019-06-25: 100 mg via INTRAVENOUS

## 2019-06-25 MED ORDER — METOPROLOL TARTRATE 5 MG/5ML IV SOLN: 2.0000 mg | INTRAVENOUS | Status: DC | PRN

## 2019-06-25 MED ORDER — VANCOMYCIN HCL IN DEXTROSE 1-5 GM/200ML-% IV SOLN
1000.0000 mg | Freq: Two times a day (BID) | INTRAVENOUS | Status: AC
  Administered 2019-06-25 – 2019-06-26 (×2): 1000 mg via INTRAVENOUS
  Filled 2019-06-25 (×3): qty 200

## 2019-06-25 MED ORDER — CLOPIDOGREL BISULFATE 75 MG PO TABS
75.0000 mg | ORAL_TABLET | Freq: Every day | ORAL | Status: DC
  Administered 2019-06-26: 75 mg via ORAL
  Filled 2019-06-25: qty 1

## 2019-06-25 MED ORDER — OXYCODONE HCL 5 MG PO TABS: 5.0000 mg | ORAL_TABLET | Freq: Once | ORAL | Status: DC | PRN

## 2019-06-25 SURGICAL SUPPLY — 63 items
APPLIER CLIP 11 MED OPEN (CLIP) ×3
APPLIER CLIP 13 LRG OPEN (CLIP) ×3
APPLIER CLIP 9.375 SM OPEN (CLIP) ×3
BAG DECANTER FOR FLEXI CONT (MISCELLANEOUS) ×3 IMPLANT
BLADE SURG 15 STRL LF DISP TIS (BLADE) ×2 IMPLANT
BLADE SURG 15 STRL SS (BLADE) ×4
BLADE SURG SZ11 CARB STEEL (BLADE) ×3 IMPLANT
BOOT SUTURE AID YELLOW STND (SUTURE) ×6 IMPLANT
BRUSH SCRUB EZ  4% CHG (MISCELLANEOUS) ×2
BRUSH SCRUB EZ 4% CHG (MISCELLANEOUS) ×1 IMPLANT
CLIP APPLIE 11 MED OPEN (CLIP) ×1 IMPLANT
CLIP APPLIE 13 LRG OPEN (CLIP) ×1 IMPLANT
CLIP APPLIE 9.375 SM OPEN (CLIP) ×1 IMPLANT
COVER WAND RF STERILE (DRAPES) ×3 IMPLANT
DERMABOND ADVANCED (GAUZE/BANDAGES/DRESSINGS) ×4
DERMABOND ADVANCED .7 DNX12 (GAUZE/BANDAGES/DRESSINGS) ×2 IMPLANT
ELECT CAUTERY BLADE 6.4 (BLADE) ×6 IMPLANT
ELECT REM PT RETURN 9FT ADLT (ELECTROSURGICAL) ×6
ELECTRODE REM PT RTRN 9FT ADLT (ELECTROSURGICAL) ×2 IMPLANT
GAUZE 4X4 16PLY RFD (DISPOSABLE) ×6 IMPLANT
GLOVE BIO SURGEON STRL SZ7 (GLOVE) ×3 IMPLANT
GLOVE BIO SURGEON STRL SZ8 (GLOVE) ×3 IMPLANT
GOWN STRL REUS W/ TWL LRG LVL3 (GOWN DISPOSABLE) ×2 IMPLANT
GOWN STRL REUS W/ TWL XL LVL3 (GOWN DISPOSABLE) ×2 IMPLANT
GOWN STRL REUS W/TWL LRG LVL3 (GOWN DISPOSABLE) ×4
GOWN STRL REUS W/TWL XL LVL3 (GOWN DISPOSABLE) ×4
HEMOSTAT SURGICEL 2X3 (HEMOSTASIS) ×6 IMPLANT
IV NS 1000ML (IV SOLUTION) ×2
IV NS 1000ML BAXH (IV SOLUTION) ×1 IMPLANT
LABEL OR SOLS (LABEL) ×3 IMPLANT
LOOP RED MAXI  1X406MM (MISCELLANEOUS) ×8
LOOP VESSEL MAXI 1X406 RED (MISCELLANEOUS) ×4 IMPLANT
LOOP VESSEL MINI 0.8X406 BLUE (MISCELLANEOUS) ×2 IMPLANT
LOOPS BLUE MINI 0.8X406MM (MISCELLANEOUS) ×4
NDL HYPO 25X1 1.5 SAFETY (NEEDLE) ×1 IMPLANT
NDL SAFETY ECLIPSE 18X1.5 (NEEDLE) ×1 IMPLANT
NEEDLE HYPO 18GX1.5 SHARP (NEEDLE) ×2
NEEDLE HYPO 25X1 1.5 SAFETY (NEEDLE) ×3 IMPLANT
PACK BASIN MAJOR ARMC (MISCELLANEOUS) ×3 IMPLANT
PENCIL ELECTRO HAND CTR (MISCELLANEOUS) ×3 IMPLANT
SPONGE LAP 18X18 RF (DISPOSABLE) ×6 IMPLANT
SUT MNCRL 4-0 (SUTURE) ×8
SUT MNCRL 4-0 27XMFL (SUTURE) ×4
SUT MNCRL+ 5-0 UNDYED PC-3 (SUTURE) ×2 IMPLANT
SUT MONOCRYL 5-0 (SUTURE) ×4
SUT PROLENE 5 0 RB 1 DA (SUTURE) ×12 IMPLANT
SUT PROLENE 6 0 BV (SUTURE) ×30 IMPLANT
SUT SILK 2 0 (SUTURE) ×2
SUT SILK 2-0 18XBRD TIE 12 (SUTURE) ×1 IMPLANT
SUT SILK 3 0 (SUTURE) ×2
SUT SILK 3-0 18XBRD TIE 12 (SUTURE) ×1 IMPLANT
SUT SILK 4 0 (SUTURE) ×2
SUT SILK 4-0 18XBRD TIE 12 (SUTURE) ×1 IMPLANT
SUT VIC AB 2-0 CT1 (SUTURE) ×18 IMPLANT
SUT VIC AB 4-0 SH 27 (SUTURE) ×4
SUT VIC AB 4-0 SH 27XANBCTRL (SUTURE) ×2 IMPLANT
SUT VICRYL+ 3-0 36IN CT-1 (SUTURE) ×18 IMPLANT
SUTURE MNCRL 4-0 27XMF (SUTURE) ×4 IMPLANT
SYR 10ML LL (SYRINGE) ×3 IMPLANT
SYR 20ML LL LF (SYRINGE) ×6 IMPLANT
SYR 3ML LL SCALE MARK (SYRINGE) ×3 IMPLANT
SYR BULB 3OZ (MISCELLANEOUS) ×3 IMPLANT
TOWEL OR 17X26 4PK STRL BLUE (TOWEL DISPOSABLE) ×3 IMPLANT

## 2019-06-25 SURGICAL SUPPLY — 28 items
CATH ACCU-VU SIZ PIG 5F 70CM (CATHETERS) ×2 IMPLANT
CATH BALLN CODA 9X100X32 (BALLOONS) ×2 IMPLANT
CATH BEACON 5 .035 65 KMP TIP (CATHETERS) ×2 IMPLANT
COVER PROBE U/S 5X48 (MISCELLANEOUS) ×2 IMPLANT
DEVICE CLOSURE PERCLS PRGLD 6F (VASCULAR PRODUCTS) IMPLANT
DEVICE PRESTO INFLATION (MISCELLANEOUS) ×3 IMPLANT
DEVICE SAFEGUARD 24CM (GAUZE/BANDAGES/DRESSINGS) ×4 IMPLANT
DEVICE TORQUE .025-.038 (MISCELLANEOUS) ×2 IMPLANT
DRYSEAL FLEXSHEATH 12FR 33CM (SHEATH) ×2
DRYSEAL FLEXSHEATH 16FR 33CM (SHEATH) ×2
EXCLUDER TNK LEG 23MX14X14 (Endovascular Graft) IMPLANT
EXCLUDER TRUNK LEG 23MX14X14 (Endovascular Graft) ×3 IMPLANT
GLIDEWIRE STIFF .35X180X3 HYDR (WIRE) ×2 IMPLANT
NDL ENTRY 21GA 7CM ECHOTIP (NEEDLE) IMPLANT
NEEDLE ENTRY 21GA 7CM ECHOTIP (NEEDLE) ×3 IMPLANT
PACK ANGIOGRAPHY (CUSTOM PROCEDURE TRAY) ×2 IMPLANT
PERCLOSE PROGLIDE 6F (VASCULAR PRODUCTS) ×15
SET INTRO CAPELLA COAXIAL (SET/KITS/TRAYS/PACK) ×2 IMPLANT
SHEATH BRITE TIP 6FRX11 (SHEATH) ×4 IMPLANT
SHEATH BRITE TIP 8FRX11 (SHEATH) ×4 IMPLANT
SHEATH DRYSEAL FLEX 12FR 33CM (SHEATH) IMPLANT
SHEATH DRYSEAL FLEX 16FR 33CM (SHEATH) IMPLANT
SPONGE XRAY 4X4 16PLY STRL (MISCELLANEOUS) ×6 IMPLANT
SYR MEDRAD MARK 7 150ML (SYRINGE) ×2 IMPLANT
TOWEL OR 17X26 4PK STRL BLUE (TOWEL DISPOSABLE) ×2 IMPLANT
TUBING CONTRAST HIGH PRESS 72 (TUBING) ×2 IMPLANT
WIRE AMPLATZ SSTIFF .035X260CM (WIRE) ×4 IMPLANT
WIRE J 3MM .035X145CM (WIRE) ×4 IMPLANT

## 2019-06-25 NOTE — Op Note (Signed)
OPERATIVE NOTE   PROCEDURE: 1. US guidance for vascular access, bilateral femoral arteries 2. Catheter placement into aorta from bilateral femoral approaches 3. Placement of a 23 mm proximal, 14 cm length, 14 mm distal Gore Excluder Endoprosthesis main body right with a 14 mm diameter by 12 cm length left contralateral limb 4. ProGlide closure devices bilateral femoral arteries  PRE-OPERATIVE DIAGNOSIS: AAA  POST-OPERATIVE DIAGNOSIS: same  SURGEON: Leotis Pain, MD and Hortencia Pilar, MD - Co-surgeons  ANESTHESIA: General  ESTIMATED BLOOD LOSS: 30  cc  FINDING(S): 1.  AAA  SPECIMEN(S):  none  INDICATIONS:   Derrick Jackson is a 86 y.o. male who presents with a rapidly expanding abdominal aortic aneurysm nearing 5 cm in maximal diameter with almost a centimeter of growth in the past year. The anatomy was suitable for endovascular repair.  Risks and benefits of repair in an endovascular fashion were discussed and informed consent was obtained. Co-surgeons are used to expedite the procedure and reduce operative time as bilateral work needs to be done.  DESCRIPTION: After obtaining full informed written consent, the patient was brought back to the operating room and placed supine upon the operating table.  The patient received IV antibiotics prior to induction.  After obtaining adequate anesthesia, the patient was prepped and draped in the standard fashion for endovascular AAA repair.  We then began by gaining access to both femoral arteries with US guidance with me working on the right and Dr. Delana Meyer working on the left.  The femoral arteries were found to be patent and accessed without difficulty with a needle under ultrasound guidance without difficulty on each side and permanent images were recorded.  We then placed 2 proglide devices on each side in a pre-close fashion and placed 8 French sheaths. The patient was then given 5000 units of intravenous heparin. The Pigtail catheter  was placed into the aorta from the left side. Using this image, we selected a 23 mm proximal, 14 cm length Main body device.  Over a stiff wire, an 16 French sheath was placed up the right side. The main body was then placed through the 16 French sheath. A Kumpe catheter was placed up the left side and a magnified image at the renal arteries was performed. The main body was then deployed just below the lowest renal artery.  The renal arteries came off at approximately the same level and they were parked just at the base the renal arteries.  The Kumpe catheter was used to cannulate the contralateral gate without difficulty and successful cannulation was confirmed by twirling the pigtail catheter in the main body. We then placed a stiff wire and a retrograde arteriogram was performed through the left femoral sheath. We upsized to the 12 [place] sheath for the contralateral limb and a 14 mm diameter by 12 cm length left iliac limb was selected and deployed. The main body deployment was then completed. Based off the angiographic findings, extension limbs were not necessary. All junction points and seals zones were treated with the compliant balloon. The pigtail catheter was then replaced and a completion angiogram was performed.  No endoleak was detected on completion angiography. The renal arteries were found to be widely patent.  The hypogastric arteries were also patent. At this point we elected to terminate the procedure. We secured the pro glide devices for hemostasis on the femoral arteries. The skin incision was closed with a 4-0 Monocryl. Dermabond and pressure dressing were placed. The patient was taken  to the recovery room in stable condition having tolerated the procedure well.  COMPLICATIONS: none  CONDITION: stable  Leotis Pain  06/25/2019, 1:37 PM   This note was created with Dragon Medical transcription system. Any errors in dictation are purely unintentional.

## 2019-06-25 NOTE — Op Note (Signed)
OPERATIVE NOTE   PROCEDURE: 1. US guidance for vascular access, bilateral femoral arteries 2. Catheter placement into aorta from bilateral femoral approaches 3. Placement of a 23 x 14 x 14 Gore Excluder Endoprosthesis main body with a 14 x 12 contralateral limb 4. ProGlide closure devices bilateral femoral arteries  PRE-OPERATIVE DIAGNOSIS: AAA  POST-OPERATIVE DIAGNOSIS: same  SURGEON: Hortencia Pilar, MD and Leotis Pain, MD - Co-surgeons  ANESTHESIA: general  ESTIMATED BLOOD LOSS: 75 cc  FINDING(S): 1.  AAA  SPECIMEN(S):  none  INDICATIONS:   Laster D Brumbach is a 86 y.o. y.o. male who presents with a rapidly growing abdominal aortic aneurysm.  He is now at high risk for lethal rupture and is therefore undergoing endovascular repair.  Risks and benefits were reviewed all questions have been answered patient has agreed to proceed..  DESCRIPTION: After obtaining full informed written consent, the patient was brought back to the operating room and placed supine upon the operating table.  The patient received IV antibiotics prior to induction.  After obtaining adequate anesthesia, the patient was prepped and draped in the standard fashion for endovascular AAA repair.  Co-surgeons are required because this is a complex bilateral procedure with work being performed simultaneously from both the right femoral and left femoral approach.  This also expedites the procedure making a shorter operative time reducing complications and improving patient safety.  We then began by gaining access to both femoral arteries with US guidance with me working on the left and Dr. Lucky Cowboy working on the patient's right.  The femoral arteries were found to be patent and accessed without difficulty with a needle under ultrasound guidance without difficulty on each side and permanent images were recorded.  We then placed 2 proglide devices on each side in a pre-close fashion and placed 8 French sheaths.  The  patient was then given 5000 units of intravenous heparin.   The Pigtail catheter was placed into the aorta from the right side. Using this image, we selected a 23 x 14 x 14 Main body device.  Over a stiff wire, an 16 French sheath was placed. The main body was then placed through the 18 French sheath. A Kumpe catheter was placed up the left side and a magnified image at the renal arteries was performed. The main body was then deployed just below the lowest renal artery. The Kumpe catheter was used to cannulate the contralateral gate without difficulty and successful cannulation was confirmed by twirling the pigtail catheter in the main body. We then placed a stiff wire and a retrograde arteriogram was performed through the left femoral sheath. We upsized to the 12 [place] sheath for the contralateral limb and a 14 x 12 limb was selected and deployed. The main body deployment was then completed. Based off the angiographic findings, extension limbs were not necessary.  All junction points and seals zones were treated with the compliant balloon.   The pigtail catheter was then replaced and a completion angiogram was performed.   No endoleak was detected on completion angiography. The renal arteries were found to be widely patent.    At this point we elected to terminate the procedure. We secured the pro glide devices for hemostasis on the femoral arteries. The skin incision was closed with a 4-0 Monocryl. Dermabond and pressure dressing were placed. The patient was taken to the recovery room in stable condition having tolerated the procedure well.  COMPLICATIONS: none  CONDITION: stable  Hortencia Pilar  06/25/2019, 1:33  PM

## 2019-06-25 NOTE — Anesthesia Procedure Notes (Signed)
Arterial Line Insertion Start/End2/24/2021 11:40 AM, 06/25/2019 11:50 AM Performed by: Emmie Niemann, MD, anesthesiologist  Preanesthetic checklist: patient identified, IV checked, site marked, risks and benefits discussed, surgical consent, monitors and equipment checked, pre-op evaluation, timeout performed and anesthesia consent Right, radial was placed Catheter size: 20 G Hand hygiene performed  and Seldinger technique used Allen's test indicative of satisfactory collateral circulation Attempts: 2 Procedure performed using ultrasound guided technique. Ultrasound Notes:anatomy identified, needle tip was noted to be adjacent to the nerve/plexus identified and no ultrasound evidence of intravascular and/or intraneural injection Following insertion, dressing applied and Biopatch. Patient tolerated the procedure well with no immediate complications.

## 2019-06-25 NOTE — Anesthesia Preprocedure Evaluation (Signed)
Anesthesia Evaluation  Patient identified by MRN, date of birth, ID band Patient awake    Reviewed: Allergy & Precautions, H&P , NPO status , Patient's Chart, lab work & pertinent test results  History of Anesthesia Complications Negative for: history of anesthetic complications  Airway Mallampati: III  TM Distance: >3 FB Neck ROM: limited    Dental  (+) Chipped, Poor Dentition   Pulmonary neg pulmonary ROS, neg shortness of breath,           Cardiovascular Exercise Tolerance: Good hypertension, (-) angina+ CAD  (-) Past MI and (-) DOE + dysrhythmias      Neuro/Psych  Neuromuscular disease negative psych ROS   GI/Hepatic negative GI ROS, Neg liver ROS, neg GERD  ,  Endo/Other  negative endocrine ROS  Renal/GU      Musculoskeletal   Abdominal   Peds  Hematology negative hematology ROS (+)   Anesthesia Other Findings Past Medical History: No date: AAA (abdominal aortic aneurysm) ([place]) 08/29/2017: Hx of basal cell carcinoma     Comment:  R infrascapular back, R distal lat. bicep near elbow No date: Hyperlipidemia No date: Hypertension  Past Surgical History: No date: CARDIAC SURGERY No date: CATARACT EXTRACTION, BILATERAL No date: CHOLECYSTECTOMY No date: COLONOSCOPY No date: CORONARY ARTERY BYPASS GRAFT No date: HERNIA REPAIR  BMI    Body Mass Index: 28.25 kg/m      Reproductive/Obstetrics negative OB ROS                             Anesthesia Physical Anesthesia Plan  ASA: III  Anesthesia Plan: General ETT   Post-op Pain Management:    Induction: Intravenous  PONV Risk Score and Plan: Ondansetron, Dexamethasone, Midazolam and Treatment may vary due to age or medical condition  Airway Management Planned: Oral ETT  Additional Equipment: Arterial line  Intra-op Plan:   Post-operative Plan: Extubation in OR  Informed Consent: I have reviewed the patients History  and Physical, chart, labs and discussed the procedure including the risks, benefits and alternatives for the proposed anesthesia with the patient or authorized representative who has indicated his/her understanding and acceptance.     Dental Advisory Given  Plan Discussed with: Anesthesiologist, CRNA and Surgeon  Anesthesia Plan Comments: (Patient consented for risks of anesthesia including but not limited to:  - adverse reactions to medications - damage to teeth, lips or other oral mucosa - sore throat or hoarseness - Damage to heart, brain, lungs or loss of life  Patient voiced understanding.)        Anesthesia Quick Evaluation

## 2019-06-25 NOTE — Anesthesia Procedure Notes (Addendum)
Procedure Name: Intubation Date/Time: 06/25/2019 11:43 AM Performed by: Emmie Niemann, MD Pre-anesthesia Checklist: Patient identified, Patient being monitored, Timeout performed, Emergency Drugs available and Suction available Patient Re-evaluated:Patient Re-evaluated prior to induction Oxygen Delivery Method: Circle system utilized Preoxygenation: Pre-oxygenation with 100% oxygen Induction Type: IV induction Ventilation: Mask ventilation without difficulty Laryngoscope Size: 4 and McGraph Grade View: Grade III Tube type: Oral Tube size: 7.5 mm Number of attempts: 2 Airway Equipment and Method: Stylet Placement Confirmation: ETT inserted through vocal cords under direct vision,  positive ETCO2 and breath sounds checked- equal and bilateral Secured at: 21 cm Tube secured with: Tape Dental Injury: Teeth and Oropharynx as per pre-operative assessment  Difficulty Due To: Difficult Airway- due to anterior larynx, Difficult Airway- due to reduced neck mobility and Difficulty was unanticipated Comments: Initially DL unsuccessful with MAC 3, second DL with McGrath MAC 4 successful

## 2019-06-25 NOTE — Transfer of Care (Signed)
Immediate Anesthesia Transfer of Care Note  Patient: Faiz D Arther  Procedure(s) Performed: ABDOMINAL AORTIC ENDOVASCULAR STENT GRAFT (N/A )  Patient Location: PACU  Anesthesia Type:General  Level of Consciousness: sedated  Airway & Oxygen Therapy: Patient Spontanous Breathing and Patient connected to face mask oxygen  Post-op Assessment: Report given to RN and Post -op Vital signs reviewed and stable  Post vital signs: Reviewed and stable  Last Vitals:  Vitals Value Taken Time  BP 141/72 06/25/19 1325  Temp    Pulse 68 06/25/19 1328  Resp 22 06/25/19 1328  SpO2 100 % 06/25/19 1328  Vitals shown include unvalidated device data.  Last Pain:  Vitals:   06/25/19 1042  TempSrc: Oral  PainSc: 0-No pain         Complications: No apparent anesthesia complications

## 2019-06-25 NOTE — H&P (Signed)
[facility] Admission History & Physical  MRN : [id number]  Derrick Jackson is a 86 y.o. (August 25, 1931) male who presents with chief complaint of No chief complaint on file. Marland Kitchen  History of Present Illness: patient presents today for repair of his AAA. He has undergone a CTA that I have independently reviewed.  This demonstrates a 4.8 cm infrarenal abdominal aortic aneurysm with a reasonably good neck below the renal arteries.  His iliac and femoral arteries are generous without significant stenosis identified on the CT scan.  This clearly represents almost a centimeter of growth in the past year and an aneurysm now approaching 5 cm in maximal diameter. He has no new complaints.  No AAA symptoms. Specifically, the patient denies new back or abdominal pain, or signs of peripheral embolization   Current Facility-Administered Medications  Medication Dose Route Frequency Provider Last Rate Last Admin  . clindamycin (CLEOCIN) IVPB 300 mg  300 mg Intravenous On Call to OR Kris Hartmann, NP      . famotidine (PEPCID) tablet 20 mg  20 mg Oral Once Gunnar Fusi, MD      . HYDROmorphone (DILAUDID) injection 1 mg  1 mg Intravenous Once PRN Kris Hartmann, NP      . lactated ringers infusion   Intravenous Continuous Gunnar Fusi, MD      . ondansetron [facility]) injection 4 mg  4 mg Intravenous Q6H PRN Kris Hartmann, NP        Past Medical History:  Diagnosis Date  . AAA (abdominal aortic aneurysm) ([place])   . Hx of basal cell carcinoma 08/29/2017   R infrascapular back, R distal lat. bicep near elbow  . Hyperlipidemia   . Hypertension     Past Surgical History:  Procedure Laterality Date  . CARDIAC SURGERY    . CATARACT EXTRACTION, BILATERAL    . CHOLECYSTECTOMY    . COLONOSCOPY    . CORONARY ARTERY BYPASS GRAFT    . HERNIA REPAIR      Social History       Tobacco Use  . Smoking status: Never Smoker  . Smokeless tobacco: Never Used  Substance  Use Topics  . Alcohol use: No  . Drug use: No          Family History  Problem Relation Age of Onset  . Heart disease Mother   . Heart disease Father   no bleeding or clotting disorders       Allergies  Allergen Reactions  . Penicillins Hives  . Tetanus Antitoxin   . Tetanus Toxoids Other (See Comments)    Chest pain     REVIEW OF SYSTEMS (Negative unless checked)  Constitutional: [] ?Weight loss  [] ?Fever  [] ?Chills Cardiac: [] ?Chest pain   [] ?Chest pressure   [] ?Palpitations   [] ?Shortness of breath when laying flat   [] ?Shortness of breath at rest   [] ?Shortness of breath with exertion. Vascular:  [] ?Pain in legs with walking   [] ?Pain in legs at rest   [] ?Pain in legs when laying flat   [] ?Claudication   [] ?Pain in feet when walking  [] ?Pain in feet at rest  [] ?Pain in feet when laying flat   [] ?History of DVT   [] ?Phlebitis   [] ?Swelling in legs   [] ?Varicose veins   [] ?Non-healing ulcers Pulmonary:   [] ?Uses home oxygen   [] ?Productive cough   [] ?Hemoptysis   [] ?Wheeze  [] ?COPD   [] ?Asthma Neurologic:  [] ?Dizziness  [] ?Blackouts   [] ?Seizures   [] ?  History of stroke   [] ?History of TIA  [] ?Aphasia   [] ?Temporary blindness   [] ?Dysphagia   [] ?Weakness or numbness in arms   [] ?Weakness or numbness in legs Musculoskeletal:  [x] ?Arthritis   [] ?Joint swelling   [] ?Joint pain   [x] ?Low back pain Hematologic:  [] ?Easy bruising  [] ?Easy bleeding   [] ?Hypercoagulable state   [] ?Anemic   Gastrointestinal:  [] ?Blood in stool   [] ?Vomiting blood  [] ?Gastroesophageal reflux/heartburn   [] ?Abdominal pain Genitourinary:  [] ?Chronic kidney disease   [] ?Difficult urination  [] ?Frequent urination  [] ?Burning with urination   [] ?Hematuria Skin:  [] ?Rashes   [] ?Ulcers   [] ?Wounds Psychological:  [] ?History of anxiety   [] ? History of major depression.    Physical Examination  Vitals:   06/19/19 1006 06/25/19 1042  BP:  (!) 150/91  Pulse:  (!) 58  Resp:  (!) 23  Temp:  97.8  F (36.6 C)  TempSrc:  Oral  SpO2:  99%  Weight: 79.4 kg 79.4 kg  Height: 5\' 6"  (1.676 m) 5\' 6"  (1.676 m)   Body mass index is 28.25 kg/m. Gen: WD/WN, NAD Head: [place]/AT, No temporalis wasting.  Ear/Nose/Throat: Hearing diminished, nares w/o erythema or drainage, oropharynx w/o Erythema/Exudate,  Eyes: Conjunctiva clear, sclera non-icteric Neck: Trachea midline.  No JVD.  Pulmonary:  Good air movement, respirations not labored, no use of accessory muscles.  Cardiac: RRR, normal S1, S2. Vascular:  Vessel Right Left  Radial Palpable Palpable                          PT Palpable Palpable  DP Palpable Palpable   Gastrointestinal: soft, non-tender/non-distended. Increased aortic impulse Musculoskeletal: M/S 5/5 throughout.  Extremities without ischemic changes.  No deformity or atrophy.  Neurologic: Sensation grossly intact in extremities.  Symmetrical.  Speech is fluent. Motor exam as listed above. Psychiatric: Judgment intact, Mood & affect appropriate for pt's clinical situation. Dermatologic: No rashes or ulcers noted.  No cellulitis or open wounds.      CBC Lab Results  Component Value Date   WBC 7.3 06/23/2019   HGB 13.8 06/23/2019   HCT 41.3 06/23/2019   MCV 94.7 06/23/2019   PLT 149 (L) 06/23/2019    BMET    Component Value Date/Time   NA 142 06/23/2019 1229   K 4.2 06/23/2019 1229   CL 107 06/23/2019 1229   CO2 29 06/23/2019 1229   GLUCOSE 110 (H) 06/23/2019 1229   BUN 18 06/23/2019 1229   CREATININE 0.90 06/23/2019 1229   CALCIUM 9.1 06/23/2019 1229   GFRNONAA >60 06/23/2019 1229   GFRAA >60 06/23/2019 1229   Estimated Creatinine Clearance: 59.4 mL/min (by C-G formula based on SCr of 0.9 mg/dL).  COAG Lab Results  Component Value Date   INR 1.0 06/23/2019   INR 1.4 09/13/2007    Radiology No results found.    Assessment/Plan Hyperlipidemia lipid control important in reducing the progression of atherosclerotic disease. Continue statin  therapy  Benign essential hypertension blood pressure control important in reducing the progression of atherosclerotic disease. On appropriate oral medications.   AAA (abdominal aortic aneurysm) without rupture ([place]) He has undergone a CTA that I have independently reviewed.  This demonstrates a 4.8 cm infrarenal abdominal aortic aneurysm with a reasonably good neck below the renal arteries.  His iliac and femoral arteries are generous without significant stenosis identified on the CT scan.  This clearly represents almost a centimeter of growth in the past year and an  aneurysm now approaching 5 cm in maximal diameter. Given the fairly rapid growth with an aneurysm approaching 5 cm in maximal diameter, repair is indicated.  He does have favorable anatomy for a stent graft repair and this would be our plan of repair.  I have discussed the procedure in detail with the patient.  I discussed the risks and benefits.  Patient is wife are agreeable to proceed   Leotis Pain, MD  06/25/2019 10:56 AM

## 2019-06-26 ENCOUNTER — Telehealth (INDEPENDENT_AMBULATORY_CARE_PROVIDER_SITE_OTHER): Payer: Self-pay

## 2019-06-26 DIAGNOSIS — I714 Abdominal aortic aneurysm, without rupture: Principal | ICD-10-CM

## 2019-06-26 LAB — CBC
HCT: 38.8 % — ABNORMAL LOW (ref 39.0–52.0)
Hemoglobin: 13.6 g/dL (ref 13.0–17.0)
MCH: 31.9 pg (ref 26.0–34.0)
MCHC: 35.1 g/dL (ref 30.0–36.0)
MCV: 91.1 fL (ref 80.0–100.0)
Platelets: 154 10*3/uL (ref 150–400)
RBC: 4.26 MIL/uL (ref 4.22–5.81)
RDW: 13.4 % (ref 11.5–15.5)
WBC: 12.3 10*3/uL — ABNORMAL HIGH (ref 4.0–10.5)
nRBC: 0 % (ref 0.0–0.2)

## 2019-06-26 LAB — BASIC METABOLIC PANEL
Anion gap: 7 (ref 5–15)
BUN: 14 mg/dL (ref 8–23)
CO2: 22 mmol/L (ref 22–32)
Calcium: 8.5 mg/dL — ABNORMAL LOW (ref 8.9–10.3)
Chloride: 108 mmol/L (ref 98–111)
Creatinine, Ser: 0.81 mg/dL (ref 0.61–1.24)
GFR calc Af Amer: >60 mL/min (ref >60)
GFR calc non Af Amer: >60 mL/min (ref >60)
Glucose, Bld: 149 mg/dL — ABNORMAL HIGH (ref 70–99)
Potassium: 3.9 mmol/L (ref 3.5–5.1)
Sodium: 137 mmol/L (ref 135–145)

## 2019-06-26 LAB — MAGNESIUM: Magnesium: 2 mg/dL (ref 1.7–2.4)

## 2019-06-26 MED ORDER — CLOPIDOGREL BISULFATE 75 MG PO TABS: 75.0000 mg | ORAL_TABLET | Freq: Every day | ORAL | 3 refills | Status: DC

## 2019-06-26 MED ORDER — ACETAMINOPHEN 325 MG PO TABS: 325.0000 mg | ORAL_TABLET | ORAL | Status: DC | PRN

## 2019-06-26 MED ORDER — CHLORHEXIDINE GLUCONATE CLOTH 2 % EX PADS
6.0000 | MEDICATED_PAD | Freq: Every day | CUTANEOUS | Status: DC
  Administered 2019-06-26: 6 via TOPICAL

## 2019-06-26 MED ORDER — CHLORHEXIDINE GLUCONATE CLOTH 2 % EX PADS: 6.0000 | MEDICATED_PAD | Freq: Every day | CUTANEOUS | Status: DC

## 2019-06-26 NOTE — Telephone Encounter (Signed)
Patient's wife Derrick Jackson left a message regarding [facility] home[facility] contacting them, I explained that it is a courtesy that I set up for surgical patient's and if it is needed great but if not needed they don't have to use it.

## 2019-06-26 NOTE — Progress Notes (Signed)
Patient discharged via private vehicle. Discharge instructions explained and given to patient. No further questions at this time. No s/s of distress noted. IVs removed.

## 2019-06-26 NOTE — Discharge Summary (Addendum)
[facility]    Discharge Summary  Patient ID:  Derrick Jackson MRN: [id number] DOB/AGE: 84-Jul-1933 84 y.o.  Admit date: 06/25/2019 Discharge date: 06/26/2019 Date of Surgery: 06/25/2019 Surgeon: Surgeon(s): Dew, Erskine Squibb, MD Schnier, Dolores Lory, MD  Admission Diagnosis: AAA (abdominal aortic aneurysm) without rupture [facility]) [I71.4]  Discharge Diagnoses:  AAA (abdominal aortic aneurysm) without rupture [facility]) [I71.4]  Secondary Diagnoses: Past Medical History:  Diagnosis Date  . AAA (abdominal aortic aneurysm) ([place])   . Hx of basal cell carcinoma 08/29/2017   R infrascapular back, R distal lat. bicep near elbow  . Hyperlipidemia   . Hypertension    Procedure(s): ABDOMINAL AORTIC ENDOVASCULAR STENT GRAFT  Discharged Condition: Good  HPI / Hospital Course:  Derrick Jackson is a 86 y.o. male is S/P:  Procedure(s): 1. US guidance for vascular access, bilateral femoral arteries 2. Catheter placement into aorta from bilateral femoral approaches 3. Placement of a 23 mm proximal, 14 cm length, 14 mm distal Gore Excluder Endoprosthesis main body right with a 14 mm diameter by 12 cm length left contralateral limb 4. ProGlide closure devices bilateral femoral arteries  Derrick Jackson is a 86 y.o. male who presents with a rapidly expanding abdominal aortic aneurysm nearing 5 cm in maximal diameter with almost a centimeter of growth in the past year. The anatomy was suitable for endovascular repair.  Risks and benefits of repair in an endovascular fashion were discussed and informed consent was obtained. On 06/25/19, the patient underwent a endovascular AAA repair.  The patient tolerated the procedure well was transferred from the recovery room to the ICU for observation overnight.  The patient's night of surgery was unremarkable.  On POD#1, the patient's diet was advanced, his pain was controlled with the use of p.o. pain medication, his Foley was removed and  he was urinating independently and he was ambulating at baseline.  Upon discharge, the patient was afebrile with stable vital signs and an unremarkable physical exam.  Extubated: POD # 0  Physical exam:  A&Ox3, NAD CV: RRR Pulm: CTA Bilaterally Abdomen: Soft, Non-tender, Non-distended, (+) Bowel Sounds Right Groin:  Access Site: Clean, dry and intact. No swelling or drainage.  Left Groin:  Access Site: Clean, dry and intact. No swelling or drainage.  GU: Foley removed Extremities: Warm, non-tender, minimal edema Neuro: Intact, 5/5 upper / lower   Labs as below  Complications: None  Consults: None  Significant Diagnostic Studies: CBC Lab Results  Component Value Date   WBC 12.3 (H) 06/26/2019   HGB 13.6 06/26/2019   HCT 38.8 (L) 06/26/2019   MCV 91.1 06/26/2019   PLT 154 06/26/2019   BMET    Component Value Date/Time   NA 137 06/26/2019 0423   K 3.9 06/26/2019 0423   CL 108 06/26/2019 0423   CO2 22 06/26/2019 0423   GLUCOSE 149 (H) 06/26/2019 0423   BUN 14 06/26/2019 0423   CREATININE 0.81 06/26/2019 0423   CALCIUM 8.5 (L) 06/26/2019 0423   GFRNONAA >60 06/26/2019 0423   GFRAA >60 06/26/2019 0423   COAG Lab Results  Component Value Date   INR 1.0 06/23/2019   INR 1.4 09/13/2007   Disposition:  Discharge to :Home  Allergies as of 06/26/2019      Reactions   Penicillins Hives   Tetanus Antitoxin    Tetanus Toxoids Other (See Comments)   Chest pain      Medication List    TAKE these medications   acetaminophen  325 MG tablet Commonly known as: TYLENOL Take 1-2 tablets (325-650 mg total) by mouth every 4 (four) hours as needed for mild pain (or temp >/= 101 F).   amLODipine 2.5 MG tablet Commonly known as: NORVASC Take 2.5 mg by mouth daily.   aspirin EC 81 MG tablet Take 81 mg by mouth daily.   atorvastatin 40 MG tablet Commonly known as: LIPITOR Take 40 mg by mouth daily.   clopidogrel 75 MG tablet Commonly known as: PLAVIX Take 1 tablet  (75 mg total) by mouth daily at 6 (six) AM. Start taking on: June 27, 2019   ramipril 2.5 MG capsule Commonly known as: ALTACE Take 2.5 mg by mouth daily.      Verbal and written Discharge instructions given to the patient. Wound care per Discharge AVS Follow-up Information    Kris Hartmann, NP Follow up in 1 week(s).   Specialty: Vascular Surgery Why: First post-op incision check. No studies.  Contact information: [place] [address] [phone]          Signed: Sela Hua, PA-C  06/26/2019, 11:09 AM

## 2019-06-26 NOTE — Discharge Instructions (Addendum)
Vascular Surgery Discharge Instructions  1) You may shower. Keep groins clean and dry.  2) No driving until you are cleared at your first post-op visit.

## 2019-06-26 NOTE — Anesthesia Postprocedure Evaluation (Signed)
Anesthesia Post Note  Patient: Derrick Jackson  Procedure(s) Performed: ABDOMINAL AORTIC ENDOVASCULAR STENT GRAFT (N/A )  Patient location during evaluation: ICU Anesthesia Type: General Level of consciousness: awake, awake and alert and oriented Pain management: pain level controlled Vital Signs Assessment: post-procedure vital signs reviewed and stable Respiratory status: spontaneous breathing, nonlabored ventilation and respiratory function stable Cardiovascular status: blood pressure returned to baseline and stable Anesthetic complications: no     Last Vitals:  Vitals:   06/26/19 0807 06/26/19 0930  BP: (!) 154/64 (!) 149/69  Pulse:    Resp:  20  Temp:    SpO2:      Last Pain:  Vitals:   06/26/19 0800  TempSrc:   PainSc: 0-No pain                 [name]

## 2019-06-26 NOTE — Progress Notes (Signed)
[place] visited pt. briefly while rounding on ICU and observing pt. calling for assistance.  Pt. shared he is scheduled for discharge today; he has a daughter who works as a Therapist, sports in [place], [place].  [place]. and [place] discussed dtr.'s theological education @ [employer] and [employer]; [facility] excused himself when Pt. reached for handheld urinal to allow for privacy.       06/26/19 1030  Clinical Encounter Type  Visited With Patient  Visit Type Initial  Referral From Other (Comment) (Rounding)

## 2019-07-04 ENCOUNTER — Other Ambulatory Visit: Payer: Self-pay

## 2019-07-04 ENCOUNTER — Ambulatory Visit (INDEPENDENT_AMBULATORY_CARE_PROVIDER_SITE_OTHER): Payer: Medicare Other | Admitting: Nurse Practitioner

## 2019-07-04 ENCOUNTER — Encounter (INDEPENDENT_AMBULATORY_CARE_PROVIDER_SITE_OTHER): Payer: Self-pay | Admitting: Nurse Practitioner

## 2019-07-04 VITALS — BP 155/78 | HR 56 | Resp 16 | Wt 173.4 lb

## 2019-07-04 DIAGNOSIS — E782 Mixed hyperlipidemia: Secondary | ICD-10-CM

## 2019-07-04 DIAGNOSIS — I10 Essential (primary) hypertension: Secondary | ICD-10-CM

## 2019-07-04 DIAGNOSIS — I714 Abdominal aortic aneurysm, without rupture, unspecified: Secondary | ICD-10-CM

## 2019-07-04 NOTE — Progress Notes (Signed)
SUBJECTIVE:  Patient ID: Derrick Jackson, male    DOB: June 03, 1931, 86 y.o.   MRN: [id number] Chief Complaint  Patient presents with  . Follow-up    [facility] 1week wound check    HPI  Derrick Jackson is a 86 y.o. male presents today for wound check following abdominal aortic aneurysm 1 week ago.  The patient states that following his procedure he had very little pain and very little difficulty.  The only difficulty the patient experience was during his hospital stay where the anesthesia caused some visual hallucinations.  However today he is back at baseline.  The patient has not required any pain medication during this time.  Of the patient's bilateral groin sites are covered with Dermabond and they look clean dry and intact.  There is some slight bruising bilaterally at the groin sites.  Patient is tolerating Plavix and aspirin without issue.  Overall, the patient is doing well.  Past Medical History:  Diagnosis Date  . AAA (abdominal aortic aneurysm) ([place])   . Hx of basal cell carcinoma 08/29/2017   R infrascapular back, R distal lat. bicep near elbow  . Hyperlipidemia   . Hypertension     Past Surgical History:  Procedure Laterality Date  . ABDOMINAL AORTIC ENDOVASCULAR STENT GRAFT N/A 06/25/2019   Procedure: ABDOMINAL AORTIC ENDOVASCULAR STENT GRAFT;  Surgeon: Algernon Huxley, MD;  Location: [facility] ORS;  Service: Vascular;  Laterality: N/A;  . CARDIAC SURGERY    . CATARACT EXTRACTION, BILATERAL    . CHOLECYSTECTOMY    . COLONOSCOPY    . CORONARY ARTERY BYPASS GRAFT    . ENDOVASCULAR REPAIR/STENT GRAFT N/A 06/25/2019   Procedure: ENDOVASCULAR REPAIR/STENT GRAFT;  Surgeon: Algernon Huxley, MD;  Location: [place] [facility];  Service: Cardiovascular;  Laterality: N/A;  . HERNIA REPAIR      Social History   Socioeconomic History  . Marital status: Married    Spouse name: Not on file  . Number of children: Not on file  . Years of education: Not on file  . Highest education level:  Not on file  Occupational History  . Not on file  Tobacco Use  . Smoking status: Never Smoker  . Smokeless tobacco: Never Used  Substance and Sexual Activity  . Alcohol use: No  . Drug use: No  . Sexual activity: Not on file  Other Topics Concern  . Not on file  Social History Narrative   Lives at home with wife in private residence   Social Determinants of Health   Financial Resource Strain:   . Difficulty of Paying Living Expenses: Not on file  Food Insecurity:   . Worried About Charity fundraiser in the Last Year: Not on file  . Ran Out of Food in the Last Year: Not on file  Transportation Needs:   . Lack of Transportation (Medical): Not on file  . Lack of Transportation (Non-Medical): Not on file  Physical Activity:   . Days of Exercise per Week: Not on file  . Minutes of Exercise per Session: Not on file  Stress:   . Feeling of Stress : Not on file  Social Connections:   . Frequency of Communication with Friends and Family: Not on file  . Frequency of Social Gatherings with Friends and Family: Not on file  . Attends Religious Services: Not on file  . Active Member of Clubs or Organizations: Not on file  . Attends Archivist Meetings: Not on file  .  Marital Status: Not on file  Intimate Partner Violence:   . Fear of Current or Ex-Partner: Not on file  . Emotionally Abused: Not on file  . Physically Abused: Not on file  . Sexually Abused: Not on file    Family History  Problem Relation Age of Onset  . Heart disease Mother   . Heart disease Father     Allergies  Allergen Reactions  . Penicillins Hives  . Tetanus Antitoxin   . Tetanus Toxoids Other (See Comments)    Chest pain     Review of Systems   Review of Systems: Negative Unless Checked Constitutional: [] Weight loss  [] Fever  [] Chills Cardiac: [] Chest pain   []  Atrial Fibrillation  [] Palpitations   [] Shortness of breath when laying flat   [] Shortness of breath with exertion. [] Shortness  of breath at rest Vascular:  [] Pain in legs with walking   [] Pain in legs with standing [] Pain in legs when laying flat   [] Claudication    [] Pain in feet when laying flat    [] History of DVT   [] Phlebitis   [] Swelling in legs   [] Varicose veins   [] Non-healing ulcers Pulmonary:   [] Uses home oxygen   [] Productive cough   [] Hemoptysis   [] Wheeze  [] COPD   [] Asthma Neurologic:  [] Dizziness   [] Seizures  [] Blackouts [] History of stroke   [] History of TIA  [] Aphasia   [] Temporary Blindness   [] Weakness or numbness in arm   [] Weakness or numbness in leg Musculoskeletal:   [] Joint swelling   [] Joint pain   [] Low back pain  []  History of Knee Replacement [] Arthritis [] back Surgeries  []  Spinal Stenosis    Hematologic:  [x] Easy bruising  [] Easy bleeding   [] Hypercoagulable state   [] Anemic Gastrointestinal:  [] Diarrhea   [] Vomiting  [] Gastroesophageal reflux/heartburn   [] Difficulty swallowing. [] Abdominal pain Genitourinary:  [] Chronic kidney disease   [] Difficult urination  [] Anuric   [] Blood in urine [] Frequent urination  [] Burning with urination   [] Hematuria Skin:  [] Rashes   [] Ulcers [] Wounds Psychological:  [] History of anxiety   []  History of major depression  []  Memory Difficulties      OBJECTIVE:   Physical Exam  BP (!) 155/78 (BP Location: Right Arm)   Pulse (!) 56   Resp 16   Wt 173 lb 6.4 oz (78.7 kg)   BMI 27.99 kg/m   Gen: WD/WN, NAD Head: [place]/AT, No temporalis wasting.  Ear/Nose/Throat: Hearing grossly intact, nares w/o erythema or drainage Eyes: PER, EOMI, sclera nonicteric.  Neck: Supple, no masses.  No JVD.  Pulmonary:  Good air movement, no use of accessory muscles.  Cardiac: RRR Vascular:  Slight bruising in the groins, well approximated no signs of infection Vessel Right Left  Femoral Palpable Palpable   Gastrointestinal: soft, non-distended. No guarding/no peritoneal signs.  Musculoskeletal: M/S 5/5 throughout.  No deformity or atrophy.  Neurologic: Pain and light  touch intact in extremities.  Symmetrical.  Speech is fluent. Motor exam as listed above. Psychiatric: Judgment intact, Mood & affect appropriate for pt's clinical situation.        ASSESSMENT AND PLAN:  1. AAA (abdominal aortic aneurysm) without rupture (HCC) Overall the patient is doing well immediately following surgery.  We will have the patient return to the office in 4 to 6 weeks for noninvasive studies to evaluate the status of the stent graft.  Discussed with the patient when he will be able to resume driving.  Cautioned patient against doing more strenuous exercise such as weight  machine or returning to the golf course.  However utilizing the treadmill, walking or even the elliptical would be adequate exercise at this time.  We also discussed the use of Plavix to which the patient can expect to continue to utilize it for at least 6 months.  2. Benign essential hypertension Good blood pressure today, patient on appropriate medications no changes needed today.  3. Mixed hyperlipidemia Good cholesterol control is important for slowing atherosclerotic disease in addition to aneurysm progression, on appropriate medications no changes needed today.   Current Outpatient Medications on File Prior to Visit  Medication Sig Dispense Refill  . acetaminophen (TYLENOL) 325 MG tablet Take 1-2 tablets (325-650 mg total) by mouth every 4 (four) hours as needed for mild pain (or temp >/= 101 F).    Marland Kitchen amLODipine (NORVASC) 2.5 MG tablet Take 2.5 mg by mouth daily.     Marland Kitchen aspirin EC 81 MG tablet Take 81 mg by mouth daily.     Marland Kitchen atorvastatin (LIPITOR) 40 MG tablet Take 40 mg by mouth daily.     . clopidogrel (PLAVIX) 75 MG tablet Take 1 tablet (75 mg total) by mouth daily at 6 (six) AM. 90 tablet 3  . ramipril (ALTACE) 2.5 MG capsule Take 2.5 mg by mouth daily.     No current facility-administered medications on file prior to visit.    There are no Patient Instructions on file for this visit. No  follow-ups on file.   Kris Hartmann, NP  This note was completed with Sales executive.  Any errors are purely unintentional.

## 2019-08-04 ENCOUNTER — Other Ambulatory Visit (INDEPENDENT_AMBULATORY_CARE_PROVIDER_SITE_OTHER): Payer: Self-pay | Admitting: Nurse Practitioner

## 2019-08-04 DIAGNOSIS — T82330A Leakage of aortic (bifurcation) graft (replacement), initial encounter: Secondary | ICD-10-CM

## 2019-08-04 DIAGNOSIS — IMO0001 Reserved for inherently not codable concepts without codable children: Secondary | ICD-10-CM

## 2019-08-06 ENCOUNTER — Ambulatory Visit (INDEPENDENT_AMBULATORY_CARE_PROVIDER_SITE_OTHER): Payer: Medicare Other

## 2019-08-06 ENCOUNTER — Other Ambulatory Visit: Payer: Self-pay

## 2019-08-06 ENCOUNTER — Encounter (INDEPENDENT_AMBULATORY_CARE_PROVIDER_SITE_OTHER): Payer: Self-pay | Admitting: Nurse Practitioner

## 2019-08-06 ENCOUNTER — Ambulatory Visit (INDEPENDENT_AMBULATORY_CARE_PROVIDER_SITE_OTHER): Payer: Medicare Other | Admitting: Nurse Practitioner

## 2019-08-06 VITALS — BP 159/71 | HR 55 | Resp 16 | Wt 171.4 lb

## 2019-08-06 DIAGNOSIS — IMO0001 Reserved for inherently not codable concepts without codable children: Secondary | ICD-10-CM

## 2019-08-06 DIAGNOSIS — E782 Mixed hyperlipidemia: Secondary | ICD-10-CM

## 2019-08-06 DIAGNOSIS — T82330A Leakage of aortic (bifurcation) graft (replacement), initial encounter: Secondary | ICD-10-CM

## 2019-08-06 DIAGNOSIS — I714 Abdominal aortic aneurysm, without rupture, unspecified: Secondary | ICD-10-CM

## 2019-08-06 DIAGNOSIS — I10 Essential (primary) hypertension: Secondary | ICD-10-CM

## 2019-08-06 NOTE — Progress Notes (Signed)
Subjective:    Patient ID: Derrick Jackson, male    DOB: 1931/06/10, 86 y.o.   MRN: [id number] Chief Complaint  Patient presents with  . Follow-up    ultrasound follow up    The patient returns to the office for surveillance of an abdominal aortic aneurysm status post stent graft placement on 06/25/2019.   Patient denies abdominal pain or back pain, no other abdominal complaints. No groin related complaints. No symptoms consistent with distal embolization No changes in claudication distance. The patient has started to return to normal activities.   There have been no interval changes in his overall healthcare since his last visit.   Patient denies amaurosis fugax or TIA symptoms. There is no history of claudication or rest pain symptoms of the lower extremities. The patient denies angina or shortness of breath.   Duplex US of the aorta and iliac arteries shows a 5.0 AAA sac with no endoleak, virtually no change in the sac compared to the previous study.   Review of Systems  All other systems reviewed and are negative.      Objective:   Physical Exam Vitals reviewed.  Constitutional:      Appearance: Normal appearance.  Cardiovascular:     Rate and Rhythm: Normal rate and regular rhythm.     Pulses: Normal pulses.  Pulmonary:     Effort: Pulmonary effort is normal.  Neurological:     Mental Status: He is alert and oriented to person, place, and time.  Psychiatric:        Mood and Affect: Mood normal.        Behavior: Behavior normal.        Thought Content: Thought content normal.        Judgment: Judgment normal.     BP (!) 159/71 (BP Location: Right Arm)   Pulse (!) 55   Resp 16   Wt 171 lb 6.4 oz (77.7 kg)   BMI 27.66 kg/m   Past Medical History:  Diagnosis Date  . AAA (abdominal aortic aneurysm) ([place])   . Hx of basal cell carcinoma 08/29/2017   R infrascapular back, R distal lat. bicep near elbow  . Hyperlipidemia   . Hypertension     Social History    Socioeconomic History  . Marital status: Married    Spouse name: Not on file  . Number of children: Not on file  . Years of education: Not on file  . Highest education level: Not on file  Occupational History  . Not on file  Tobacco Use  . Smoking status: Never Smoker  . Smokeless tobacco: Never Used  Substance and Sexual Activity  . Alcohol use: No  . Drug use: No  . Sexual activity: Not on file  Other Topics Concern  . Not on file  Social History Narrative   Lives at home with wife in private residence   Social Determinants of Health   Financial Resource Strain:   . Difficulty of Paying Living Expenses:   Food Insecurity:   . Worried About Charity fundraiser in the Last Year:   . Arboriculturist in the Last Year:   Transportation Needs:   . Film/video editor (Medical):   Marland Kitchen Lack of Transportation (Non-Medical):   Physical Activity:   . Days of Exercise per Week:   . Minutes of Exercise per Session:   Stress:   . Feeling of Stress :   Social Connections:   . Frequency of  Communication with Friends and Family:   . Frequency of Social Gatherings with Friends and Family:   . Attends Religious Services:   . Active Member of Clubs or Organizations:   . Attends Archivist Meetings:   Marland Kitchen Marital Status:   Intimate Partner Violence:   . Fear of Current or Ex-Partner:   . Emotionally Abused:   Marland Kitchen Physically Abused:   . Sexually Abused:     Past Surgical History:  Procedure Laterality Date  . ABDOMINAL AORTIC ENDOVASCULAR STENT GRAFT N/A 06/25/2019   Procedure: ABDOMINAL AORTIC ENDOVASCULAR STENT GRAFT;  Surgeon: Algernon Huxley, MD;  Location: [facility];  Service: Vascular;  Laterality: N/A;  . CARDIAC SURGERY    . CATARACT EXTRACTION, BILATERAL    . CHOLECYSTECTOMY    . COLONOSCOPY    . CORONARY ARTERY BYPASS GRAFT    . ENDOVASCULAR REPAIR/STENT GRAFT N/A 06/25/2019   Procedure: ENDOVASCULAR REPAIR/STENT GRAFT;  Surgeon: Algernon Huxley, MD;  Location: [place] [facility];  Service: Cardiovascular;  Laterality: N/A;  . HERNIA REPAIR      Family History  Problem Relation Age of Onset  . Heart disease Mother   . Heart disease Father     Allergies  Allergen Reactions  . Penicillins Hives  . Tetanus Antitoxin   . Tetanus Toxoids Other (See Comments)    Chest pain       Assessment & Plan:   1. AAA (abdominal aortic aneurysm) without rupture (HCC) Recommend: Patient is status post successful endovascular repair of the AAA.   No further intervention is required at this time.   No endoleak is detected and the aneurysm sac is stable.  The patient will continue antiplatelet therapy as prescribed as well as aggressive management of hyperlipidemia. Exercise is again strongly encouraged.   However, endografts require continued surveillance with ultrasound or CT scan. This is mandatory to detect any changes that allow repressurization of the aneurysm sac.  The patient is informed that this would be asymptomatic.  The patient is reminded that lifelong routine surveillance is a necessity with an endograft. Patient will continue to follow-up at 6 month intervals with ultrasound of the aorta.  2. Mixed hyperlipidemia Continue statin as ordered and reviewed, no changes at this time  3. Benign essential hypertension Continue antihypertensive medications as already ordered, these medications have been reviewed and there are no changes at this time.    Current Outpatient Medications on File Prior to Visit  Medication Sig Dispense Refill  . acetaminophen (TYLENOL) 325 MG tablet Take 1-2 tablets (325-650 mg total) by mouth every 4 (four) hours as needed for mild pain (or temp >/= 101 F).    Marland Kitchen amLODipine (NORVASC) 2.5 MG tablet Take 2.5 mg by mouth daily.     Marland Kitchen aspirin EC 81 MG tablet Take 81 mg by mouth daily.     Marland Kitchen atorvastatin (LIPITOR) 40 MG tablet Take 40 mg by mouth daily.     . clopidogrel (PLAVIX) 75 MG tablet Take 1 tablet (75 mg total)  by mouth daily at 6 (six) AM. 90 tablet 3  . ramipril (ALTACE) 2.5 MG capsule Take 2.5 mg by mouth daily.     No current facility-administered medications on file prior to visit.    There are no Patient Instructions on file for this visit. No follow-ups on file.   Kris Hartmann, NP

## 2020-01-06 ENCOUNTER — Other Ambulatory Visit (INDEPENDENT_AMBULATORY_CARE_PROVIDER_SITE_OTHER): Payer: Self-pay | Admitting: Nurse Practitioner

## 2020-01-06 DIAGNOSIS — I714 Abdominal aortic aneurysm, without rupture, unspecified: Secondary | ICD-10-CM

## 2020-01-07 ENCOUNTER — Ambulatory Visit (INDEPENDENT_AMBULATORY_CARE_PROVIDER_SITE_OTHER): Payer: Medicare Other | Admitting: Nurse Practitioner

## 2020-01-07 ENCOUNTER — Other Ambulatory Visit: Payer: Self-pay

## 2020-01-07 ENCOUNTER — Ambulatory Visit (INDEPENDENT_AMBULATORY_CARE_PROVIDER_SITE_OTHER): Payer: Medicare Other

## 2020-01-07 ENCOUNTER — Encounter (INDEPENDENT_AMBULATORY_CARE_PROVIDER_SITE_OTHER): Payer: Self-pay | Admitting: Nurse Practitioner

## 2020-01-07 VITALS — BP 161/72 | HR 50 | Ht 63.0 in | Wt 168.0 lb

## 2020-01-07 DIAGNOSIS — I714 Abdominal aortic aneurysm, without rupture, unspecified: Secondary | ICD-10-CM

## 2020-01-07 DIAGNOSIS — I10 Essential (primary) hypertension: Secondary | ICD-10-CM | POA: Diagnosis not present

## 2020-01-07 DIAGNOSIS — E782 Mixed hyperlipidemia: Secondary | ICD-10-CM

## 2020-01-08 ENCOUNTER — Encounter (INDEPENDENT_AMBULATORY_CARE_PROVIDER_SITE_OTHER): Payer: Self-pay | Admitting: Nurse Practitioner

## 2020-01-08 NOTE — Progress Notes (Signed)
Subjective:    Patient ID: Derrick Jackson, male    DOB: [birth year], 86 y.o.   MRN: [id number] Chief Complaint  Patient presents with  . Follow-up    24Mo Eval    The patient returns to the office for surveillance of an abdominal aortic aneurysm status post stent graft placement on 06/25/2019.   Patient denies abdominal pain or back pain, no other abdominal complaints. No groin related complaints. No symptoms consistent with distal embolization No changes in claudication distance.   There have been no interval changes in his overall healthcare since his last visit.   Patient denies amaurosis fugax or TIA symptoms. There is no history of claudication or rest pain symptoms of the lower extremities. The patient denies angina or shortness of breath.   Duplex US of the aorta and iliac arteries shows a 4.3 AAA sac with no endoleak, decrease in the sac compared to the previous study.   Review of Systems  Musculoskeletal: Negative for gait problem.  Skin: Negative for color change and pallor.  Neurological: Negative for weakness.  All other systems reviewed and are negative.      Objective:   Physical Exam Vitals reviewed.  HENT:     Head: Normocephalic.  Cardiovascular:     Rate and Rhythm: Normal rate and regular rhythm.     Pulses: Normal pulses.     Heart sounds: Normal heart sounds.  Pulmonary:     Effort: Pulmonary effort is normal.     Breath sounds: Normal breath sounds.  Skin:    General: Skin is warm and dry.  Neurological:     Mental Status: He is alert and oriented to person, place, and time.  Psychiatric:        Mood and Affect: Mood normal.        Behavior: Behavior normal.        Thought Content: Thought content normal.        Judgment: Judgment normal.     BP (!) 161/72   Pulse (!) 50   Ht 5\' 3"  (1.6 m)   Wt 168 lb (76.2 kg)   BMI 29.76 kg/m   Past Medical History:  Diagnosis Date  . AAA (abdominal aortic aneurysm) ([place])   . Hx of basal cell  carcinoma 08/29/2017   R infrascapular back, R distal lat. bicep near elbow  . Hyperlipidemia   . Hypertension     Social History   Socioeconomic History  . Marital status: Married    Spouse name: Not on file  . Number of children: Not on file  . Years of education: Not on file  . Highest education level: Not on file  Occupational History  . Not on file  Tobacco Use  . Smoking status: Never Smoker  . Smokeless tobacco: Never Used  Vaping Use  . Vaping Use: Never used  Substance and Sexual Activity  . Alcohol use: No  . Drug use: No  . Sexual activity: Not on file  Other Topics Concern  . Not on file  Social History Narrative   Lives at home with wife in private residence   Social Determinants of Health   Financial Resource Strain:   . Difficulty of Paying Living Expenses: Not on file  Food Insecurity:   . Worried About Charity fundraiser in the Last Year: Not on file  . Ran Out of Food in the Last Year: Not on file  Transportation Needs:   . Lack of Transportation (Medical):  Not on file  . Lack of Transportation (Non-Medical): Not on file  Physical Activity:   . Days of Exercise per Week: Not on file  . Minutes of Exercise per Session: Not on file  Stress:   . Feeling of Stress : Not on file  Social Connections:   . Frequency of Communication with Friends and Family: Not on file  . Frequency of Social Gatherings with Friends and Family: Not on file  . Attends Religious Services: Not on file  . Active Member of Clubs or Organizations: Not on file  . Attends Archivist Meetings: Not on file  . Marital Status: Not on file  Intimate Partner Violence:   . Fear of Current or Ex-Partner: Not on file  . Emotionally Abused: Not on file  . Physically Abused: Not on file  . Sexually Abused: Not on file    Past Surgical History:  Procedure Laterality Date  . ABDOMINAL AORTIC ENDOVASCULAR STENT GRAFT N/A 06/25/2019   Procedure: ABDOMINAL AORTIC ENDOVASCULAR  STENT GRAFT;  Surgeon: Algernon Huxley, MD;  Location: [facility];  Service: Vascular;  Laterality: N/A;  . CARDIAC SURGERY    . CATARACT EXTRACTION, BILATERAL    . CHOLECYSTECTOMY    . COLONOSCOPY    . CORONARY ARTERY BYPASS GRAFT    . ENDOVASCULAR REPAIR/STENT GRAFT N/A 06/25/2019   Procedure: ENDOVASCULAR REPAIR/STENT GRAFT;  Surgeon: Algernon Huxley, MD;  Location: [place] [facility];  Service: Cardiovascular;  Laterality: N/A;  . HERNIA REPAIR      Family History  Problem Relation Age of Onset  . Heart disease Mother   . Heart disease Father     Allergies  Allergen Reactions  . Penicillins Hives  . Tetanus Antitoxin   . Tetanus Toxoids Other (See Comments)    Chest pain       Assessment & Plan:   1. AAA (abdominal aortic aneurysm) without rupture (HCC) Recommend: Patient is status post successful endovascular repair of the AAA.   No further intervention is required at this time.   No endoleak is detected and the aneurysm sac is stable.  The patient will continue antiplatelet therapy as prescribed as well as aggressive management of hyperlipidemia. Exercise is again strongly encouraged.   However, endografts require continued surveillance with ultrasound or CT scan. This is mandatory to detect any changes that allow repressurization of the aneurysm sac.  The patient is informed that this would be asymptomatic.  The patient is reminded that lifelong routine surveillance is a necessity with an endograft. Patient will continue to follow-up at 6 month intervals with ultrasound of the aorta.  2. Benign essential hypertension Continue antihypertensive medications as already ordered, these medications have been reviewed and there are no changes at this time.   3. Mixed hyperlipidemia Continue statin as ordered and reviewed, no changes at this time    Current Outpatient Medications on File Prior to Visit  Medication Sig Dispense Refill  . amLODipine (NORVASC) 2.5 MG tablet  Take 2.5 mg by mouth daily.     Marland Kitchen aspirin EC 81 MG tablet Take 81 mg by mouth daily.     Marland Kitchen atorvastatin (LIPITOR) 40 MG tablet Take 40 mg by mouth daily.     . clopidogrel (PLAVIX) 75 MG tablet Take 1 tablet (75 mg total) by mouth daily at 6 (six) AM. 90 tablet 3  . ramipril (ALTACE) 2.5 MG capsule Take 2.5 mg by mouth daily.    Marland Kitchen acetaminophen (TYLENOL) 325 MG tablet  Take 1-2 tablets (325-650 mg total) by mouth every 4 (four) hours as needed for mild pain (or temp >/= 101 F). (Patient not taking: Reported on 01/07/2020)     No current facility-administered medications on file prior to visit.    There are no Patient Instructions on file for this visit. No follow-ups on file.   Kris Hartmann, NP

## 2020-04-07 ENCOUNTER — Encounter: Payer: PRIVATE HEALTH INSURANCE | Admitting: Dermatology

## 2020-06-09 ENCOUNTER — Other Ambulatory Visit (INDEPENDENT_AMBULATORY_CARE_PROVIDER_SITE_OTHER): Payer: Self-pay | Admitting: Vascular Surgery

## 2020-07-01 ENCOUNTER — Other Ambulatory Visit (INDEPENDENT_AMBULATORY_CARE_PROVIDER_SITE_OTHER): Payer: Self-pay | Admitting: Nurse Practitioner

## 2020-07-01 DIAGNOSIS — I714 Abdominal aortic aneurysm, without rupture, unspecified: Secondary | ICD-10-CM

## 2020-07-05 ENCOUNTER — Other Ambulatory Visit: Payer: Self-pay

## 2020-07-05 ENCOUNTER — Ambulatory Visit (INDEPENDENT_AMBULATORY_CARE_PROVIDER_SITE_OTHER): Payer: Medicare Other | Admitting: Dermatology

## 2020-07-05 DIAGNOSIS — Z85828 Personal history of other malignant neoplasm of skin: Secondary | ICD-10-CM | POA: Diagnosis not present

## 2020-07-05 DIAGNOSIS — L578 Other skin changes due to chronic exposure to nonionizing radiation: Secondary | ICD-10-CM

## 2020-07-05 DIAGNOSIS — L280 Lichen simplex chronicus: Secondary | ICD-10-CM | POA: Diagnosis not present

## 2020-07-05 DIAGNOSIS — Z1283 Encounter for screening for malignant neoplasm of skin: Secondary | ICD-10-CM | POA: Diagnosis not present

## 2020-07-05 DIAGNOSIS — D229 Melanocytic nevi, unspecified: Secondary | ICD-10-CM

## 2020-07-05 DIAGNOSIS — D1800 Hemangioma unspecified site: Secondary | ICD-10-CM

## 2020-07-05 DIAGNOSIS — L814 Other melanin hyperpigmentation: Secondary | ICD-10-CM | POA: Diagnosis not present

## 2020-07-05 DIAGNOSIS — L821 Other seborrheic keratosis: Secondary | ICD-10-CM

## 2020-07-05 MED ORDER — BETAMETHASONE DIPROPIONATE 0.05 % EX CREA: TOPICAL_CREAM | Freq: Two times a day (BID) | CUTANEOUS | 0 refills | Status: DC

## 2020-07-05 NOTE — Progress Notes (Signed)
Follow-Up Visit   Subjective  Derrick Jackson is a 87 y.o. male who presents for the following: tbse (Patient here today for tbse. Patient has history of basal cell carcinoma on right infrascapular back and right distal lateral bicep near elbow. He denies any new concerns today. ). Patient here for full body skin exam and skin cancer screening.  The following portions of the chart were reviewed this encounter and updated as appropriate:  Tobacco  Allergies  Meds  Problems  Med Hx  Surg Hx  Fam Hx     Objective  Well appearing patient in no apparent distress; mood and affect are within normal limits.  A full examination was performed including scalp, head, eyes, ears, nose, lips, neck, chest, axillae, abdomen, back, buttocks, bilateral upper extremities, bilateral lower extremities, hands, feet, fingers, toes, fingernails, and toenails. All findings within normal limits unless otherwise noted below.  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 It is often associated with allergic rhinitis and/or asthma and can require treatment with topical medications, phototherapy, or in severe cases a biologic medication called Dupixent in older children and adults.   Recommend starting Eucrisa discussed if areas of shoulder are not improved with medication will consider opzelura, adbry, dupixent or others.    Patient's insurance does not cover Eucrisa, so betamethasone dipropionate 0.05 % cream was prescribed to start using twice daily to affected areas of bilateral shoulders.   Topical steroids (such as triamcinolone, fluocinolone, fluocinonide, mometasone, clobetasol,  halobetasol, betamethasone, hydrocortisone) can cause thinning and lightening of the skin if they are used for too long in the same area. Your physician has selected the right strength medicine for your problem and area affected on the body. Please use your medication only as directed by your physician to prevent side effects.  Avoid applying to face, groin, and axilla. Use as directed. Risk of skin atrophy with long-term use reviewed.   Ordered Medications: betamethasone dipropionate 0.05 % cream  Skin cancer screening   Lentigines - Scattered tan macules - Due to sun exposure - Benign-appering, observe - Recommend daily broad spectrum sunscreen SPF 30+ to sun-exposed areas, reapply every 2 hours as needed. - Call for any changes  Seborrheic Keratoses - Stuck-on, waxy, tan-brown papules and plaques  - Discussed benign etiology and prognosis. - Observe - Call for any changes  Melanocytic Nevi - Tan-brown and/or pink-flesh-colored symmetric macules and papules - Benign appearing on exam today - Observation - Call clinic for new or changing moles - Recommend daily use of broad spectrum spf 30+ sunscreen to sun-exposed areas.   Hemangiomas - Red papules - Discussed benign nature - Observe - Call for any changes  Actinic Damage - Chronic, secondary to cumulative UV/sun exposure - diffuse scaly erythematous macules with underlying dyspigmentation - Recommend daily broad spectrum sunscreen SPF 30+ to sun-exposed areas, reapply every 2 hours as needed.  - Call for new or changing lesions.  History of Basal Cell Carcinoma of the Skin R infrascapular back, R distal lat. bicep near elbow - No evidence of recurrence today - Recommend regular full body skin exams - Recommend daily broad spectrum sunscreen SPF 30+ to sun-exposed areas, reapply every 2 hours as needed.  - Call if  any new or changing lesions are noted between office visits  Skin cancer screening performed  today.  Return in about 6 weeks (around 08/16/2020) for follow up on bilateral shoulders, 6 month tbse.  IRuthell Rummage, CMA, am acting as scribe for Sarina Ser, MD.  Documentation: I have reviewed the above documentation for accuracy and completeness, and I agree with the above.  Sarina Ser, MD

## 2020-07-06 ENCOUNTER — Encounter: Payer: Self-pay | Admitting: Dermatology

## 2020-07-07 ENCOUNTER — Encounter (INDEPENDENT_AMBULATORY_CARE_PROVIDER_SITE_OTHER): Payer: Self-pay | Admitting: Nurse Practitioner

## 2020-07-07 ENCOUNTER — Ambulatory Visit (INDEPENDENT_AMBULATORY_CARE_PROVIDER_SITE_OTHER): Payer: Medicare Other

## 2020-07-07 ENCOUNTER — Ambulatory Visit (INDEPENDENT_AMBULATORY_CARE_PROVIDER_SITE_OTHER): Payer: Medicare Other | Admitting: Nurse Practitioner

## 2020-07-07 ENCOUNTER — Other Ambulatory Visit: Payer: Self-pay

## 2020-07-07 VITALS — BP 148/70 | HR 48 | Ht 64.0 in | Wt 168.0 lb

## 2020-07-07 DIAGNOSIS — I714 Abdominal aortic aneurysm, without rupture, unspecified: Secondary | ICD-10-CM

## 2020-07-07 DIAGNOSIS — E782 Mixed hyperlipidemia: Secondary | ICD-10-CM | POA: Diagnosis not present

## 2020-07-07 DIAGNOSIS — I10 Essential (primary) hypertension: Secondary | ICD-10-CM | POA: Diagnosis not present

## 2020-07-07 NOTE — Progress Notes (Signed)
Subjective:    Patient ID: Derrick Jackson, male    DOB: 1931/06/28, 87 y.o.   MRN: [id number] Chief Complaint  Patient presents with  . Follow-up    U/S    The patient returns to the office for surveillance of an abdominal aortic aneurysm status post stent graft placement on 06/25/2019.   Patient denies abdominal pain or back pain, no other abdominal complaints. No groin related complaints. No symptoms consistent with distal embolization No changes in claudication distance.   There have been no interval changes in his overall healthcare since his last visit.   Patient denies amaurosis fugax or TIA symptoms. There is no history of claudication or rest pain symptoms of the lower extremities. The patient denies angina or shortness of breath.   Duplex US of the aorta and iliac arteries shows a 4.58 cm x 4.75 cm AAA sac with no endoleak, previous diameter on 01/07/2020 was 4.3 cm.  The previous diameter before that was 5 cm.   Review of Systems  Gastrointestinal: Negative for abdominal pain.  All other systems reviewed and are negative.      Objective:   Physical Exam Vitals reviewed.  HENT:     Head: Normocephalic.  Cardiovascular:     Rate and Rhythm: Normal rate.     Pulses: Normal pulses.  Pulmonary:     Effort: Pulmonary effort is normal.  Skin:    General: Skin is warm and dry.  Neurological:     Mental Status: He is alert and oriented to person, place, and time. Mental status is at baseline.  Psychiatric:        Mood and Affect: Mood normal.        Behavior: Behavior normal.        Thought Content: Thought content normal.        Judgment: Judgment normal.     BP (!) 148/70   Pulse (!) 48   Ht 5\' 4"  (1.626 m)   Wt 168 lb (76.2 kg)   BMI 28.84 kg/m   Past Medical History:  Diagnosis Date  . AAA (abdominal aortic aneurysm) ([place])   . Hx of basal cell carcinoma 08/29/2017   R infrascapular back, R distal lat. bicep near elbow  . Hyperlipidemia   .  Hypertension     Social History   Socioeconomic History  . Marital status: Married    Spouse name: Not on file  . Number of children: Not on file  . Years of education: Not on file  . Highest education level: Not on file  Occupational History  . Not on file  Tobacco Use  . Smoking status: Never Smoker  . Smokeless tobacco: Never Used  Vaping Use  . Vaping Use: Never used  Substance and Sexual Activity  . Alcohol use: No  . Drug use: No  . Sexual activity: Not on file  Other Topics Concern  . Not on file  Social History Narrative   Lives at home with wife in private residence   Social Determinants of Health   Financial Resource Strain: Not on file  Food Insecurity: Not on file  Transportation Needs: Not on file  Physical Activity: Not on file  Stress: Not on file  Social Connections: Not on file  Intimate Partner Violence: Not on file    Past Surgical History:  Procedure Laterality Date  . ABDOMINAL AORTIC ENDOVASCULAR STENT GRAFT N/A 06/25/2019   Procedure: ABDOMINAL AORTIC ENDOVASCULAR STENT GRAFT;  Surgeon: Algernon Huxley, MD;  Location: [facility];  Service: Vascular;  Laterality: N/A;  . CARDIAC SURGERY    . CATARACT EXTRACTION, BILATERAL    . CHOLECYSTECTOMY    . COLONOSCOPY    . CORONARY ARTERY BYPASS GRAFT    . ENDOVASCULAR REPAIR/STENT GRAFT N/A 06/25/2019   Procedure: ENDOVASCULAR REPAIR/STENT GRAFT;  Surgeon: Algernon Huxley, MD;  Location: [facility];  Service: Cardiovascular;  Laterality: N/A;  . HERNIA REPAIR      Family History  Problem Relation Age of Onset  . Heart disease Mother   . Heart disease Father     Allergies  Allergen Reactions  . Penicillins Hives  . Tetanus Antitoxin   . Tetanus Toxoids Other (See Comments)    Chest pain    CBC Latest Ref Rng & Units 06/26/2019 06/23/2019 11/26/2015  WBC 4.0 - 10.5 K/uL 12.3(H) 7.3 9.2  Hemoglobin 13.0 - 17.0 g/dL 13.6 13.8 15.2  Hematocrit 39.0 - 52.0 % 38.8(L) 41.3 42.6  Platelets 150 -  400 K/uL 154 149(L) 151      CMP     Component Value Date/Time   NA 137 06/26/2019 0423   K 3.9 06/26/2019 0423   CL 108 06/26/2019 0423   CO2 22 06/26/2019 0423   GLUCOSE 149 (H) 06/26/2019 0423   BUN 14 06/26/2019 0423   CREATININE 0.81 06/26/2019 0423   CALCIUM 8.5 (L) 06/26/2019 0423   PROT 7.5 11/26/2015 1040   ALBUMIN 4.3 11/26/2015 1040   AST 22 11/26/2015 1040   ALT 19 11/26/2015 1040   ALKPHOS 57 11/26/2015 1040   BILITOT 0.9 11/26/2015 1040   GFRNONAA >60 06/26/2019 0423   GFRAA >60 06/26/2019 0423     No results found.     Assessment & Plan:   1. Abdominal aortic aneurysm (AAA) without rupture (HCC) Currently the patient's abdominal aortic aneurysm shows some degree of growth compared to previous studies.  There is no evidence of leak.  Typically we will move the patient to an annual follow-up at this time however due to this discrepancy we will maintain follow-up visit in 6 months.  If there is evidence of further growth in 6 months we may discuss a CT scan for evaluation of possible week.  2. Benign essential hypertension Continue antihypertensive medications as already ordered, these medications have been reviewed and there are no changes at this time.   3. Mixed hyperlipidemia Continue statin as ordered and reviewed, no changes at this time    Current Outpatient Medications on File Prior to Visit  Medication Sig Dispense Refill  . amLODipine (NORVASC) 5 MG tablet Take 1 tablet by mouth daily.    Marland Kitchen atorvastatin (LIPITOR) 40 MG tablet Take 40 mg by mouth daily.     . betamethasone dipropionate 0.05 % cream Apply topically 2 (two) times daily. Apply to affected areas of shoulders 45 g 0  . clopidogrel (PLAVIX) 75 MG tablet TAKE 1 TABLET BY MOUTH EVERY DAY AT 6AM 90 tablet 3  . ramipril (ALTACE) 2.5 MG capsule Take 5 mg by mouth daily.     No current facility-administered medications on file prior to visit.    There are no Patient Instructions on file  for this visit. No follow-ups on file.   Kris Hartmann, NP

## 2020-08-23 ENCOUNTER — Ambulatory Visit (INDEPENDENT_AMBULATORY_CARE_PROVIDER_SITE_OTHER): Payer: Medicare Other | Admitting: Dermatology

## 2020-08-23 ENCOUNTER — Other Ambulatory Visit: Payer: Self-pay

## 2020-08-23 DIAGNOSIS — L281 Prurigo nodularis: Secondary | ICD-10-CM | POA: Diagnosis not present

## 2020-08-23 DIAGNOSIS — L2081 Atopic neurodermatitis: Secondary | ICD-10-CM | POA: Diagnosis not present

## 2020-08-23 NOTE — Patient Instructions (Signed)

## 2020-08-23 NOTE — Progress Notes (Signed)
   Follow-Up Visit   Subjective  Derrick Jackson is a 87 y.o. male who presents for the following: Dermatitis (6 weeks f/u dermatitis on the right shoulder, treating with Betamethasone cream with a fair response ).  The following portions of the chart were reviewed this encounter and updated as appropriate:   Tobacco  Allergies  Meds  Problems  Med Hx  Surg Hx  Fam Hx     Review of Systems:  No other skin or systemic complaints except as noted in HPI or Assessment and Plan.  Objective  Well appearing patient in no apparent distress; mood and affect are within normal limits.   examination was performed including scalp, head, eyes, ears, nose, lips, neck, chest, axillae, abdomen, back, bilateral upper extremities,  All findings within normal limits unless otherwise noted below.  Objective  Right post shoulder, right deltoid: Flesh nodules   Objective  right upper back: Mainly clear skin    Assessment & Plan  Prurigo nodularis Right post shoulder, right deltoid Pt declines intralesional steroid injections Cont Betamethasone cream as directed   Atopic neurodermatitis - improving with treatment right upper back Atopic dermatitis (eczema) is a chronic, relapsing, pruritic condition that can significantly affect quality of life. It is often associated with allergic rhinitis and/or asthma and can require treatment with topical medications, phototherapy, or in severe cases a biologic medication called Dupixent in older children and adults.    Cont Betamethasone cream qd-bid prn   Return in about 1 year (around 08/23/2021) for TBSE .  IMarye Round, CMA, am acting as scribe for Sarina Ser, MD .  Documentation: I have reviewed the above documentation for accuracy and completeness, and I agree with the above.  Sarina Ser, MD

## 2020-08-24 ENCOUNTER — Encounter: Payer: Self-pay | Admitting: Dermatology

## 2021-01-10 ENCOUNTER — Other Ambulatory Visit (INDEPENDENT_AMBULATORY_CARE_PROVIDER_SITE_OTHER): Payer: Self-pay | Admitting: Nurse Practitioner

## 2021-01-10 DIAGNOSIS — I714 Abdominal aortic aneurysm, without rupture, unspecified: Secondary | ICD-10-CM

## 2021-01-11 ENCOUNTER — Ambulatory Visit (INDEPENDENT_AMBULATORY_CARE_PROVIDER_SITE_OTHER): Payer: Medicare Other | Admitting: Vascular Surgery

## 2021-01-11 ENCOUNTER — Encounter (INDEPENDENT_AMBULATORY_CARE_PROVIDER_SITE_OTHER): Payer: Self-pay | Admitting: Vascular Surgery

## 2021-01-11 ENCOUNTER — Ambulatory Visit (INDEPENDENT_AMBULATORY_CARE_PROVIDER_SITE_OTHER): Payer: Medicare Other

## 2021-01-11 ENCOUNTER — Other Ambulatory Visit: Payer: Self-pay

## 2021-01-11 VITALS — BP 169/70 | HR 51 | Resp 16 | Wt 169.6 lb

## 2021-01-11 DIAGNOSIS — I714 Abdominal aortic aneurysm, without rupture, unspecified: Secondary | ICD-10-CM

## 2021-01-11 DIAGNOSIS — I10 Essential (primary) hypertension: Secondary | ICD-10-CM | POA: Diagnosis not present

## 2021-01-11 DIAGNOSIS — E782 Mixed hyperlipidemia: Secondary | ICD-10-CM | POA: Diagnosis not present

## 2021-01-11 NOTE — Assessment & Plan Note (Signed)
blood pressure control important in reducing the progression of atherosclerotic disease and aneurysmal growth. On appropriate oral medications.  

## 2021-01-11 NOTE — Progress Notes (Signed)
MRN : [id number]  Derrick Jackson is a 87 y.o. (1931/06/28) male who presents with chief complaint of  Chief Complaint  Patient presents with   Follow-up    Ultrasound follow up  .  History of Present Illness: Patient returns today in follow up of his abdominal aortic aneurysm.  He is about a year and a half status post endovascular repair of this aneurysm and seems to be doing well.  No aneurysm related symptoms or complaints today. Specifically, the patient denies new back or abdominal pain, or signs of peripheral embolization. Duplex today shows the abdominal aortic aneurysm to have shrunk down to 3.5 cm in maximal diameter.  The stent graft is patent with no evidence of endoleak on duplex.  Current Outpatient Medications  Medication Sig Dispense Refill   amLODipine (NORVASC) 5 MG tablet Take 1 tablet by mouth daily.     atorvastatin (LIPITOR) 40 MG tablet Take 40 mg by mouth daily.      betamethasone dipropionate 0.05 % cream Apply topically 2 (two) times daily. Apply to affected areas of shoulders 45 g 0   clopidogrel (PLAVIX) 75 MG tablet TAKE 1 TABLET BY MOUTH EVERY DAY AT 6AM 90 tablet 3   ramipril (ALTACE) 2.5 MG capsule Take 5 mg by mouth daily.     No current facility-administered medications for this visit.    Past Medical History:  Diagnosis Date   AAA (abdominal aortic aneurysm) (HCC)    Hx of basal cell carcinoma 08/29/2017   R infrascapular back, R distal lat. bicep near elbow   Hyperlipidemia    Hypertension     Past Surgical History:  Procedure Laterality Date   ABDOMINAL AORTIC ENDOVASCULAR STENT GRAFT N/A 06/25/2019   Procedure: ABDOMINAL AORTIC ENDOVASCULAR STENT GRAFT;  Surgeon: Algernon Huxley, MD;  Location: [facility];  Service: Vascular;  Laterality: N/A;   CARDIAC SURGERY     CATARACT EXTRACTION, BILATERAL     CHOLECYSTECTOMY     COLONOSCOPY     CORONARY ARTERY BYPASS GRAFT     ENDOVASCULAR REPAIR/STENT GRAFT N/A 06/25/2019   Procedure: ENDOVASCULAR  REPAIR/STENT GRAFT;  Surgeon: Algernon Huxley, MD;  Location: [facility] [facility];  Service: Cardiovascular;  Laterality: N/A;   HERNIA REPAIR       Social History   Tobacco Use   Smoking status: Never   Smokeless tobacco: Never  Vaping Use   Vaping Use: Never used  Substance Use Topics   Alcohol use: No   Drug use: No      Family History  Problem Relation Age of Onset   Heart disease Mother    Heart disease Father      Allergies  Allergen Reactions   Other     Other reaction(s): Unknown Tetanus: chest pain    Penicillins Hives   Tetanus Antitoxin    Tetanus Toxoids Other (See Comments)    Chest pain     REVIEW OF SYSTEMS (Negative unless checked)  Constitutional: '[]'$ Weight loss  '[]'$ Fever  '[]'$ Chills Cardiac: '[]'$ Chest pain   '[]'$ Chest pressure   '[]'$ Palpitations   '[]'$ Shortness of breath when laying flat   '[]'$ Shortness of breath at rest   '[]'$ Shortness of breath with exertion. Vascular:  '[]'$ Pain in legs with walking   '[]'$ Pain in legs at rest   '[]'$ Pain in legs when laying flat   '[]'$ Claudication   '[]'$ Pain in feet when walking  '[]'$ Pain in feet at rest  '[]'$ Pain in feet when laying flat   '[]'$ History of  DVT   '[]'$ Phlebitis   '[]'$ Swelling in legs   '[]'$ Varicose veins   '[]'$ Non-healing ulcers Pulmonary:   '[]'$ Uses home oxygen   '[]'$ Productive cough   '[]'$ Hemoptysis   '[]'$ Wheeze  '[]'$ COPD   '[]'$ Asthma Neurologic:  '[]'$ Dizziness  '[]'$ Blackouts   '[]'$ Seizures   '[]'$ History of stroke   '[]'$ History of TIA  '[]'$ Aphasia   '[]'$ Temporary blindness   '[]'$ Dysphagia   '[]'$ Weakness or numbness in arms   '[]'$ Weakness or numbness in legs Musculoskeletal:  '[x]'$ Arthritis   '[]'$ Joint swelling   '[x]'$ Joint pain   '[]'$ Low back pain Hematologic:  '[]'$ Easy bruising  '[]'$ Easy bleeding   '[]'$ Hypercoagulable state   '[]'$ Anemic   Gastrointestinal:  '[]'$ Blood in stool   '[]'$ Vomiting blood  '[]'$ Gastroesophageal reflux/heartburn   '[]'$ Abdominal pain Genitourinary:  '[]'$ Chronic kidney disease   '[]'$ Difficult urination  '[]'$ Frequent urination  '[]'$ Burning with urination   '[]'$ Hematuria Skin:  '[]'$ Rashes    '[]'$ Ulcers   '[]'$ Wounds Psychological:  '[]'$ History of anxiety   '[]'$  History of major depression.  Physical Examination  BP (!) 169/70 (BP Location: Right Arm)   Pulse (!) 51   Resp 16   Wt 169 lb 9.6 oz (76.9 kg)   BMI 29.11 kg/m  Gen:  WD/WN, NAD. Appears younger than stated age. Head: [place]/AT, No temporalis wasting. Ear/Nose/Throat: Hearing grossly intact, nares w/o erythema or drainage Eyes: Conjunctiva clear. Sclera non-icteric Neck: Supple.  Trachea midline Pulmonary:  Good air movement, no use of accessory muscles.  Cardiac: RRR, no JVD Vascular:  Vessel Right Left  Radial Palpable Palpable               Musculoskeletal: M/S 5/5 throughout.  No deformity or atrophy. No edema. Neurologic: Sensation grossly intact in extremities.  Symmetrical.  Speech is fluent.  Psychiatric: Judgment intact, Mood & affect appropriate for pt's clinical situation. Dermatologic: No rashes or ulcers noted.  No cellulitis or open wounds.      Labs No results found for this or any previous visit (from the past 2160 hour(s)).  Radiology No results found.  Assessment/Plan  Benign essential hypertension blood pressure control important in reducing the progression of atherosclerotic disease and aneurysmal growth. On appropriate oral medications.   Mixed hyperlipidemia lipid control important in reducing the progression of atherosclerotic disease. Continue statin therapy   AAA (abdominal aortic aneurysm) without rupture (HCC) Duplex today shows the abdominal aortic aneurysm to have shrunk down to 3.5 cm in maximal diameter.  The stent graft is patent with no evidence of endoleak on duplex.  This is a marked decrease in size which is encouraging.  We will now stretch him out and follow him in 1 year with duplex.    Leotis Pain, MD  01/11/2021 10:29 AM    This note was created with Dragon medical transcription system.  Any errors from dictation are purely unintentional

## 2021-01-11 NOTE — Assessment & Plan Note (Signed)
Duplex today shows the abdominal aortic aneurysm to have shrunk down to 3.5 cm in maximal diameter.  The stent graft is patent with no evidence of endoleak on duplex.  This is a marked decrease in size which is encouraging.  We will now stretch him out and follow him in 1 year with duplex.

## 2021-01-11 NOTE — Assessment & Plan Note (Signed)
lipid control important in reducing the progression of atherosclerotic disease. Continue statin therapy  

## 2021-01-31 ENCOUNTER — Other Ambulatory Visit: Payer: Self-pay | Admitting: Neurosurgery

## 2021-01-31 DIAGNOSIS — M5412 Radiculopathy, cervical region: Secondary | ICD-10-CM

## 2021-02-10 ENCOUNTER — Ambulatory Visit
Admission: RE | Admit: 2021-02-10 | Discharge: 2021-02-10 | Disposition: A | Discharge status: Home / Self Care | Payer: Medicare Other | Source: Ambulatory Visit | Attending: Neurosurgery | Admitting: Neurosurgery

## 2021-02-10 ENCOUNTER — Other Ambulatory Visit: Payer: Self-pay

## 2021-02-10 DIAGNOSIS — M5412 Radiculopathy, cervical region: Secondary | ICD-10-CM | POA: Diagnosis present

## 2021-05-25 ENCOUNTER — Other Ambulatory Visit (INDEPENDENT_AMBULATORY_CARE_PROVIDER_SITE_OTHER): Payer: Self-pay | Admitting: Nurse Practitioner

## 2021-08-29 ENCOUNTER — Ambulatory Visit (INDEPENDENT_AMBULATORY_CARE_PROVIDER_SITE_OTHER): Payer: Medicare Other | Admitting: Dermatology

## 2021-08-29 DIAGNOSIS — D229 Melanocytic nevi, unspecified: Secondary | ICD-10-CM

## 2021-08-29 DIAGNOSIS — L821 Other seborrheic keratosis: Secondary | ICD-10-CM

## 2021-08-29 DIAGNOSIS — D1800 Hemangioma unspecified site: Secondary | ICD-10-CM

## 2021-08-29 DIAGNOSIS — Z1283 Encounter for screening for malignant neoplasm of skin: Secondary | ICD-10-CM

## 2021-08-29 DIAGNOSIS — L578 Other skin changes due to chronic exposure to nonionizing radiation: Secondary | ICD-10-CM | POA: Diagnosis not present

## 2021-08-29 DIAGNOSIS — Z85828 Personal history of other malignant neoplasm of skin: Secondary | ICD-10-CM

## 2021-08-29 DIAGNOSIS — L814 Other melanin hyperpigmentation: Secondary | ICD-10-CM

## 2021-08-29 DIAGNOSIS — L2089 Other atopic dermatitis: Secondary | ICD-10-CM | POA: Diagnosis not present

## 2021-08-29 DIAGNOSIS — L570 Actinic keratosis: Secondary | ICD-10-CM | POA: Diagnosis not present

## 2021-08-29 NOTE — Progress Notes (Signed)
? ?  Follow-Up Visit ?  ?Subjective  ?Derrick Jackson is a 88 y.o. male who presents for the following: Annual Exam (History of BCC - The patient presents for Total-Body Skin Exam (TBSE) for skin cancer screening and mole check.  The patient has spots, moles and lesions to be evaluated, some may be new or changing and the patient has concerns that these could be cancer./). ? ?The following portions of the chart were reviewed this encounter and updated as appropriate:  ? Tobacco  Allergies  Meds  Problems  Med Hx  Surg Hx  Fam Hx   ?  ?Review of Systems:  No other skin or systemic complaints except as noted in HPI or Assessment and Plan. ? ?Objective  ?Well appearing patient in no apparent distress; mood and affect are within normal limits. ? ?A full examination was performed including scalp, head, eyes, ears, nose, lips, neck, chest, axillae, abdomen, back, buttocks, bilateral upper extremities, bilateral lower extremities, hands, feet, fingers, toes, fingernails, and toenails. All findings within normal limits unless otherwise noted below. ? ?Clear ? ?Right Ear ?Erythematous thin papules/macules with gritty scale.  ? ? ?Assessment & Plan  ? ?History of Basal Cell Carcinoma of the Skin ?- No evidence of recurrence today ?- Recommend regular full body skin exams ?- Recommend daily broad spectrum sunscreen SPF 30+ to sun-exposed areas, reapply every 2 hours as needed.  ?- Call if any new or changing lesions are noted between office visits ? ?Lentigines ?- Scattered tan macules ?- Due to sun exposure ?- Benign-appearing, observe ?- Recommend daily broad spectrum sunscreen SPF 30+ to sun-exposed areas, reapply every 2 hours as needed. ?- Call for any changes ? ?Seborrheic Keratoses ?- Stuck-on, waxy, tan-brown papules and/or plaques  ?- Benign-appearing ?- Discussed benign etiology and prognosis. ?- Observe ?- Call for any changes ? ?Melanocytic Nevi ?- Tan-brown and/or pink-flesh-colored symmetric macules and  papules ?- Benign appearing on exam today ?- Observation ?- Call clinic for new or changing moles ?- Recommend daily use of broad spectrum spf 30+ sunscreen to sun-exposed areas.  ? ?Hemangiomas ?- Red papules ?- Discussed benign nature ?- Observe ?- Call for any changes ? ?Actinic Damage ?- Chronic condition, secondary to cumulative UV/sun exposure ?- diffuse scaly erythematous macules with underlying dyspigmentation ?- Recommend daily broad spectrum sunscreen SPF 30+ to sun-exposed areas, reapply every 2 hours as needed.  ?- Staying in the shade or wearing long sleeves, sun glasses (UVA+UVB protection) and wide brim hats (4-inch brim around the entire circumference of the hat) are also recommended for sun protection.  ?- Call for new or changing lesions. ? ?Skin cancer screening performed today. ? ?AK (actinic keratosis) ?Right Ear ?Destruction of lesion - Right Ear ?Complexity: simple   ?Destruction method: cryotherapy   ?Informed consent: discussed and consent obtained   ?Timeout:  patient name, date of birth, surgical site, and procedure verified ?Lesion destroyed using liquid nitrogen: Yes   ?Region frozen until ice ball extended beyond lesion: Yes   ?Outcome: patient tolerated procedure well with no complications   ?Post-procedure details: wound care instructions given   ? ?Other atopic dermatitis ?Clear today. Continue Betamethasone cream prn flares ? ?Skin cancer screening ? ?Return in about 1 year (around 08/30/2022) for TBSE. ? ?I, Ashok Cordia, CMA, am acting as scribe for Sarina Ser, MD . ?Documentation: I have reviewed the above documentation for accuracy and completeness, and I agree with the above. ? ?Sarina Ser, MD ? ? ?

## 2021-08-29 NOTE — Patient Instructions (Signed)

## 2021-09-06 ENCOUNTER — Encounter: Payer: Self-pay | Admitting: Dermatology

## 2021-09-21 ENCOUNTER — Telehealth (INDEPENDENT_AMBULATORY_CARE_PROVIDER_SITE_OTHER): Payer: Self-pay

## 2021-09-21 NOTE — Telephone Encounter (Signed)
Patient is calling in wanting to make sure that it is ok to be off of his blood thinners for a week in order to have dental surgery. I let patient know he needed to contact dentist to send over that request and we would be happy to sign. Patient thought it could just be told to him through word of mouth let him know that it is a protocol and we had to follow that.   He states he understood

## 2022-01-10 ENCOUNTER — Encounter (INDEPENDENT_AMBULATORY_CARE_PROVIDER_SITE_OTHER): Payer: Self-pay | Admitting: Nurse Practitioner

## 2022-01-10 ENCOUNTER — Ambulatory Visit (INDEPENDENT_AMBULATORY_CARE_PROVIDER_SITE_OTHER): Payer: Medicare Other

## 2022-01-10 ENCOUNTER — Ambulatory Visit (INDEPENDENT_AMBULATORY_CARE_PROVIDER_SITE_OTHER): Payer: Medicare Other | Admitting: Nurse Practitioner

## 2022-01-10 VITALS — BP 166/69 | HR 45 | Resp 17 | Ht 64.0 in | Wt 172.0 lb

## 2022-01-10 DIAGNOSIS — I10 Essential (primary) hypertension: Secondary | ICD-10-CM | POA: Diagnosis not present

## 2022-01-10 DIAGNOSIS — E782 Mixed hyperlipidemia: Secondary | ICD-10-CM

## 2022-01-10 DIAGNOSIS — I7143 Infrarenal abdominal aortic aneurysm, without rupture: Secondary | ICD-10-CM

## 2022-01-10 DIAGNOSIS — I714 Abdominal aortic aneurysm, without rupture, unspecified: Secondary | ICD-10-CM

## 2022-01-10 NOTE — Progress Notes (Signed)
Subjective:    Patient ID: Derrick Jackson, male    DOB: 1931-07-27, 88 y.o.   MRN: [id number] No chief complaint on file.   The patient returns to the office for surveillance of an abdominal aortic aneurysm status post stent graft placement on 01/10/2022.   Patient denies abdominal pain or back pain, no other abdominal complaints. No groin related complaints. No symptoms consistent with distal embolization No changes in claudication distance or new rest pain symptoms.   There have been no interval changes in his overall healthcare since his last visit.   Patient denies amaurosis fugax or TIA symptoms.  The patient denies recent episodes of angina or shortness of breath.   Duplex US of the aorta and iliac arteries shows a 3.61 AAA sac with no endoleak, little change in the sac compared to the previous study.     Review of Systems  All other systems reviewed and are negative.      Objective:   Physical Exam Vitals reviewed.  HENT:     Head: Normocephalic.  Cardiovascular:     Rate and Rhythm: Normal rate.     Pulses: Normal pulses.  Pulmonary:     Effort: Pulmonary effort is normal.  Skin:    General: Skin is warm and dry.  Neurological:     Mental Status: He is alert and oriented to person, place, and time.  Psychiatric:        Mood and Affect: Mood normal.        Behavior: Behavior normal.        Thought Content: Thought content normal.        Judgment: Judgment normal.     BP (!) 166/69 (BP Location: Right Arm)   Pulse (!) 45   Resp 17   Ht '5\' 4"'$  (1.626 m)   Wt 172 lb (78 kg)   BMI 29.52 kg/m   Past Medical History:  Diagnosis Date   AAA (abdominal aortic aneurysm) (HCC)    Hx of basal cell carcinoma 08/29/2017   R infrascapular back, R distal lat. bicep near elbow   Hyperlipidemia    Hypertension     Social History   Socioeconomic History   Marital status: Married    Spouse name: Not on file   Number of children: Not on file   Years of  education: Not on file   Highest education level: Not on file  Occupational History   Not on file  Tobacco Use   Smoking status: Never   Smokeless tobacco: Never  Vaping Use   Vaping Use: Never used  Substance and Sexual Activity   Alcohol use: No   Drug use: No   Sexual activity: Not on file  Other Topics Concern   Not on file  Social History Narrative   Lives at home with wife in private residence   Social Determinants of Health   Financial Resource Strain: Not on file  Food Insecurity: Not on file  Transportation Needs: Not on file  Physical Activity: Not on file  Stress: Not on file  Social Connections: Not on file  Intimate Partner Violence: Not on file    Past Surgical History:  Procedure Laterality Date   ABDOMINAL AORTIC ENDOVASCULAR STENT GRAFT N/A 06/25/2019   Procedure: ABDOMINAL AORTIC ENDOVASCULAR STENT GRAFT;  Surgeon: Algernon Huxley, MD;  Location: [facility];  Service: Vascular;  Laterality: N/A;   CARDIAC SURGERY     CATARACT EXTRACTION, BILATERAL     CHOLECYSTECTOMY  COLONOSCOPY     CORONARY ARTERY BYPASS GRAFT     ENDOVASCULAR REPAIR/STENT GRAFT N/A 06/25/2019   Procedure: ENDOVASCULAR REPAIR/STENT GRAFT;  Surgeon: Algernon Huxley, MD;  Location: [facility];  Service: Cardiovascular;  Laterality: N/A;   HERNIA REPAIR      Family History  Problem Relation Age of Onset   Heart disease Mother    Heart disease Father     Allergies  Allergen Reactions   Other     Other reaction(s): Unknown Tetanus: chest pain    Penicillins Hives   Tetanus Antitoxin    Tetanus Toxoids Other (See Comments)    Chest pain       Latest Ref Rng & Units 06/26/2019    4:23 AM 06/23/2019   12:29 PM 11/26/2015   10:40 AM  CBC  WBC 4.0 - 10.5 K/uL 12.3  7.3  9.2   Hemoglobin 13.0 - 17.0 g/dL 13.6  13.8  15.2   Hematocrit 39.0 - 52.0 % 38.8  41.3  42.6   Platelets 150 - 400 K/uL 154  149  151       CMP     Component Value Date/Time   NA 137 06/26/2019  0423   K 3.9 06/26/2019 0423   CL 108 06/26/2019 0423   CO2 22 06/26/2019 0423   GLUCOSE 149 (H) 06/26/2019 0423   BUN 14 06/26/2019 0423   CREATININE 0.81 06/26/2019 0423   CALCIUM 8.5 (L) 06/26/2019 0423   PROT 7.5 11/26/2015 1040   ALBUMIN 4.3 11/26/2015 1040   AST 22 11/26/2015 1040   ALT 19 11/26/2015 1040   ALKPHOS 57 11/26/2015 1040   BILITOT 0.9 11/26/2015 1040   GFRNONAA >60 06/26/2019 0423   GFRAA >60 06/26/2019 0423     No results found.     Assessment & Plan:   1. Infrarenal abdominal aortic aneurysm (AAA) without rupture (HCC) Recommend:  Patient is status post successful endovascular repair of the AAA.   No further intervention is required at this time.   No endoleak is detected and the aneurysm sac is stable.  The patient will continue antiplatelet therapy as prescribed as well as aggressive management of hyperlipidemia. Exercise is again strongly encouraged.   However, endografts require continued surveillance with ultrasound or CT scan. This is mandatory to detect any changes that allow repressurization of the aneurysm sac.  The patient is informed that this would be asymptomatic.  The patient is reminded that lifelong routine surveillance is a necessity with an endograft. Patient will continue to follow-up at the specified interval with ultrasound of the aorta.   2. Benign essential hypertension Continue antihypertensive medications as already ordered, these medications have been reviewed and there are no changes at this time.   3. Mixed hyperlipidemia Continue statin as ordered and reviewed, no changes at this time    Current Outpatient Medications on File Prior to Visit  Medication Sig Dispense Refill   amLODipine (NORVASC) 5 MG tablet Take 1 tablet by mouth daily.     atorvastatin (LIPITOR) 40 MG tablet Take 40 mg by mouth daily.      betamethasone dipropionate 0.05 % cream Apply topically 2 (two) times daily. Apply to affected areas of shoulders  45 g 0   clopidogrel (PLAVIX) 75 MG tablet TAKE 1 TABLET BY MOUTH EVERY DAY AT 6AM 90 tablet 3   oxybutynin (DITROPAN-XL) 10 MG 24 hr tablet Take 10 mg by mouth daily.     ramipril (ALTACE) 2.5 MG  capsule Take 5 mg by mouth daily.     No current facility-administered medications on file prior to visit.    There are no Patient Instructions on file for this visit. No follow-ups on file.   Kris Hartmann, NP

## 2022-02-27 ENCOUNTER — Encounter (INDEPENDENT_AMBULATORY_CARE_PROVIDER_SITE_OTHER): Payer: Self-pay

## 2022-07-07 ENCOUNTER — Other Ambulatory Visit (INDEPENDENT_AMBULATORY_CARE_PROVIDER_SITE_OTHER): Payer: Self-pay | Admitting: Vascular Surgery

## 2022-08-31 ENCOUNTER — Ambulatory Visit (INDEPENDENT_AMBULATORY_CARE_PROVIDER_SITE_OTHER): Payer: Medicare Other | Admitting: Dermatology

## 2022-08-31 VITALS — BP 139/89 | HR 69

## 2022-08-31 DIAGNOSIS — D229 Melanocytic nevi, unspecified: Secondary | ICD-10-CM

## 2022-08-31 DIAGNOSIS — Z85828 Personal history of other malignant neoplasm of skin: Secondary | ICD-10-CM

## 2022-08-31 DIAGNOSIS — Z1283 Encounter for screening for malignant neoplasm of skin: Secondary | ICD-10-CM | POA: Diagnosis not present

## 2022-08-31 DIAGNOSIS — L2081 Atopic neurodermatitis: Secondary | ICD-10-CM | POA: Diagnosis not present

## 2022-08-31 DIAGNOSIS — L280 Lichen simplex chronicus: Secondary | ICD-10-CM | POA: Diagnosis not present

## 2022-08-31 DIAGNOSIS — Z79899 Other long term (current) drug therapy: Secondary | ICD-10-CM

## 2022-08-31 DIAGNOSIS — L2089 Other atopic dermatitis: Secondary | ICD-10-CM

## 2022-08-31 DIAGNOSIS — L821 Other seborrheic keratosis: Secondary | ICD-10-CM

## 2022-08-31 DIAGNOSIS — L814 Other melanin hyperpigmentation: Secondary | ICD-10-CM

## 2022-08-31 DIAGNOSIS — L578 Other skin changes due to chronic exposure to nonionizing radiation: Secondary | ICD-10-CM

## 2022-08-31 MED ORDER — BETAMETHASONE DIPROPIONATE 0.05 % EX CREA: TOPICAL_CREAM | CUTANEOUS | 3 refills | Status: DC

## 2022-08-31 NOTE — Patient Instructions (Signed)
Due to recent changes in healthcare laws, you may see results of your pathology and/or laboratory studies on MyChart before the doctors have had a chance to review them. We understand that in some cases there may be results that are confusing or concerning to you. Please understand that not all results are received at the same time and often the doctors may need to interpret multiple results in order to provide you with the best plan of care or course of treatment. Therefore, we ask that you please give us 2 business days to thoroughly review all your results before contacting the office for clarification. Should we see a critical lab result, you will be contacted sooner.   If You Need Anything After Your Visit  If you have any questions or concerns for your doctor, please call our main line at [phone] and press option 4 to reach your doctor's medical assistant. If no one answers, please leave a voicemail as directed and we will return your call as soon as possible. Messages left after 4 pm will be answered the following business day.   You may also send us a message via MyChart. We typically respond to MyChart messages within 1-2 business days.  For prescription refills, please ask your pharmacy to contact our office. Our fax number is [phone].  If you have an urgent issue when the clinic is closed that cannot wait until the next business day, you can page your doctor at the number below.    Please note that while we do our best to be available for urgent issues outside of office hours, we are not available 24/7.   If you have an urgent issue and are unable to reach us, you may choose to seek medical care at your doctor's office, retail clinic, urgent care center, or emergency room.  If you have a medical emergency, please immediately call 911 or go to the emergency department.  Pager Numbers  - Dr. Kowalski: [phone]  - Dr. Moye: [phone]  - Dr. Stewart:  [phone]  In the event of inclement weather, please call our main line at [phone] for an update on the status of any delays or closures.  Dermatology Medication Tips: Please keep the boxes that topical medications come in in order to help keep track of the instructions about where and how to use these. Pharmacies typically print the medication instructions only on the boxes and not directly on the medication tubes.   If your medication is too expensive, please contact our office at [phone] option 4 or send us a message through MyChart.   We are unable to tell what your co-pay for medications will be in advance as this is different depending on your insurance coverage. However, we may be able to find a substitute medication at lower cost or fill out paperwork to get insurance to cover a needed medication.   If a prior authorization is required to get your medication covered by your insurance company, please allow us 1-2 business days to complete this process.  Drug prices often vary depending on where the prescription is filled and some pharmacies may offer cheaper prices.  The website www.goodrx.com contains coupons for medications through different pharmacies. The prices here do not account for what the cost may be with help from insurance (it may be cheaper with your insurance), but the website can give you the price if you did not use any insurance.  - You can print the associated coupon and take it with   your prescription to the pharmacy.  - You may also stop by our office during regular business hours and pick up a GoodRx coupon card.  - If you need your prescription sent electronically to a different pharmacy, notify our office through [facility] MyChart or by phone at [phone] option 4.     Si Usted Necesita Algo Despus de Su Visita  Tambin puede enviarnos un mensaje a travs de MyChart. Por lo general respondemos a los mensajes de MyChart en el transcurso de 1 a 2  das hbiles.  Para renovar recetas, por favor pida a su farmacia que se ponga en contacto con nuestra oficina. Nuestro nmero de fax es el [phone].  Si tiene un asunto urgente cuando la clnica est cerrada y que no puede esperar hasta el siguiente da hbil, puede llamar/localizar a su doctor(a) al nmero que aparece a continuacin.   Por favor, tenga en cuenta que aunque hacemos todo lo posible para estar disponibles para asuntos urgentes fuera del horario de oficina, no estamos disponibles las 24 horas del da, los 7 das de la semana.   Si tiene un problema urgente y no puede comunicarse con nosotros, puede optar por buscar atencin mdica  en el consultorio de su doctor(a), en una clnica privada, en un centro de atencin urgente o en una sala de emergencias.  Si tiene una emergencia mdica, por favor llame inmediatamente al 911 o vaya a la sala de emergencias.  Nmeros de bper  - Dr. Kowalski: [phone]  - Dra. Moye: [phone]  - Dra. Stewart: [phone]  En caso de inclemencias del tiempo, por favor llame a nuestra lnea principal al [phone] para una actualizacin sobre el estado de cualquier retraso o cierre.  Consejos para la medicacin en dermatologa: Por favor, guarde las cajas en las que vienen los medicamentos de uso tpico para ayudarle a seguir las instrucciones sobre dnde y cmo usarlos. Las farmacias generalmente imprimen las instrucciones del medicamento slo en las cajas y no directamente en los tubos del medicamento.   Si su medicamento es muy caro, por favor, pngase en contacto con nuestra oficina llamando al [phone] y presione la opcin 4 o envenos un mensaje a travs de MyChart.   No podemos decirle cul ser su copago por los medicamentos por adelantado ya que esto es diferente dependiendo de la cobertura de su seguro. Sin embargo, es posible que podamos encontrar un medicamento sustituto a menor costo o llenar un formulario para que el  seguro cubra el medicamento que se considera necesario.   Si se requiere una autorizacin previa para que su compaa de seguros cubra su medicamento, por favor permtanos de 1 a 2 das hbiles para completar este proceso.  Los precios de los medicamentos varan con frecuencia dependiendo del lugar de dnde se surte la receta y alguna farmacias pueden ofrecer precios ms baratos.  El sitio web www.goodrx.com tiene cupones para medicamentos de diferentes farmacias. Los precios aqu no tienen en cuenta lo que podra costar con la ayuda del seguro (puede ser ms barato con su seguro), pero el sitio web puede darle el precio si no utiliz ningn seguro.  - Puede imprimir el cupn correspondiente y llevarlo con su receta a la farmacia.  - Tambin puede pasar por nuestra oficina durante el horario de atencin regular y recoger una tarjeta de cupones de GoodRx.  - Si necesita que su receta se enve electrnicamente a una farmacia diferente, informe a nuestra oficina a travs de MyChart de [facility]   o por telfono llamando al [phone] y presione la opcin 4.  

## 2022-08-31 NOTE — Progress Notes (Signed)
Follow-Up Visit   Subjective  Derrick Jackson is a 89 y.o. male who presents for the following: Skin Cancer Screening and Full Body Skin Exam, hx of BCC  The patient presents for Total-Body Skin Exam (TBSE) for skin cancer screening and mole check. The patient has spots, moles and lesions to be evaluated, some may be new or changing and the patient has concerns that these could be cancer.    The following portions of the chart were reviewed this encounter and updated as appropriate: medications, allergies, medical history  Review of Systems:  No other skin or systemic complaints except as noted in HPI or Assessment and Plan.  Objective  Well appearing patient in no apparent distress; mood and affect are within normal limits.  A full examination was performed including scalp, head, eyes, ears, nose, lips, neck, chest, axillae, abdomen, back, buttocks, bilateral upper extremities, bilateral lower extremities, hands, feet, fingers, toes, fingernails, and toenails. All findings within normal limits unless otherwise noted below.   Relevant physical exam findings are noted in the Assessment and Plan.    Assessment & Plan   LENTIGINES, SEBORRHEIC KERATOSES, HEMANGIOMAS - Benign normal skin lesions - Benign-appearing - Call for any changes  MELANOCYTIC NEVI - Tan-brown and/or pink-flesh-colored symmetric macules and papules - Benign appearing on exam today - Observation - Call clinic for new or changing moles - Recommend daily use of broad spectrum spf 30+ sunscreen to sun-exposed areas.   ACTINIC DAMAGE - Chronic condition, secondary to cumulative UV/sun exposure - diffuse scaly erythematous macules with underlying dyspigmentation - Recommend daily broad spectrum sunscreen SPF 30+ to sun-exposed areas, reapply every 2 hours as needed.  - Staying in the shade or wearing long sleeves, sun glasses (UVA+UVB protection) and wide brim hats (4-inch brim around the entire circumference  of the hat) are also recommended for sun protection.  - Call for new or changing lesions.  Atopic neurodermatitis - improving with treatment right upper back Atopic dermatitis (eczema) is a chronic, relapsing, pruritic condition that can significantly affect quality of life. It is often associated with allergic rhinitis and/or asthma and can require treatment with topical medications, phototherapy, or in severe cases a biologic medication called Dupixent in older children and adults.     Cont Betamethasone cream qd up to 5 days a week prn, Avoid applying to face, groin, and axilla. Use as directed. Long-term use can cause thinning of the skin.   Topical steroids (such as triamcinolone, fluocinolone, fluocinonide, mometasone, clobetasol, halobetasol, betamethasone, hydrocortisone) can cause thinning and lightening of the skin if they are used for too long in the same area. Your physician has selected the right strength medicine for your problem and area affected on the body. Please use your medication only as directed by your physician to prevent side effects.     HISTORY OF BASAL CELL CARCINOMA OF THE SKIN Right infrascapular back 2019 - No evidence of recurrence today - Recommend regular full body skin exams - Recommend daily broad spectrum sunscreen SPF 30+ to sun-exposed areas, reapply every 2 hours as needed.  - Call if any new or changing lesions are noted between office visits   SKIN CANCER SCREENING PERFORMED TODAY.  Neurodermatitis  Related Medications betamethasone dipropionate 0.05 % cream Apply topically 2 (two) times daily. Apply to affected areas of shoulders   Return in about 1 year (around 08/31/2023) for TBSE, hx of BCC .  IAngelique Holm, CMA, am acting as scribe for Armida Sans, MD .  Documentation: I have reviewed the above documentation for accuracy and completeness, and I agree with the above.  Sarina Ser, MD

## 2022-09-06 ENCOUNTER — Encounter: Payer: Self-pay | Admitting: Dermatology

## 2023-01-09 ENCOUNTER — Other Ambulatory Visit (INDEPENDENT_AMBULATORY_CARE_PROVIDER_SITE_OTHER): Payer: Self-pay | Admitting: Nurse Practitioner

## 2023-01-09 DIAGNOSIS — I714 Abdominal aortic aneurysm, without rupture, unspecified: Secondary | ICD-10-CM

## 2023-01-10 ENCOUNTER — Ambulatory Visit (INDEPENDENT_AMBULATORY_CARE_PROVIDER_SITE_OTHER): Payer: Medicare Other

## 2023-01-10 ENCOUNTER — Encounter (INDEPENDENT_AMBULATORY_CARE_PROVIDER_SITE_OTHER): Payer: Self-pay | Admitting: Nurse Practitioner

## 2023-01-10 ENCOUNTER — Ambulatory Visit (INDEPENDENT_AMBULATORY_CARE_PROVIDER_SITE_OTHER): Payer: Medicare Other | Admitting: Nurse Practitioner

## 2023-01-10 VITALS — BP 143/55 | HR 37 | Resp 18 | Ht 63.0 in | Wt 160.2 lb

## 2023-01-10 DIAGNOSIS — I714 Abdominal aortic aneurysm, without rupture, unspecified: Secondary | ICD-10-CM | POA: Diagnosis not present

## 2023-01-10 DIAGNOSIS — I10 Essential (primary) hypertension: Secondary | ICD-10-CM

## 2023-01-10 DIAGNOSIS — I7143 Infrarenal abdominal aortic aneurysm, without rupture: Secondary | ICD-10-CM

## 2023-01-10 DIAGNOSIS — E782 Mixed hyperlipidemia: Secondary | ICD-10-CM | POA: Diagnosis not present

## 2023-01-10 NOTE — Progress Notes (Signed)
Subjective:    Patient ID: Derrick Jackson, male    DOB: 08-09-1931, 89 y.o.   MRN: [id number] Chief Complaint  Patient presents with   Follow-up    f/u in 1 year with [facility]    The patient returns to the office for surveillance of an abdominal aortic aneurysm status post stent graft placement 2021 .   Patient denies abdominal pain or back pain, no other abdominal complaints. No groin related complaints. No symptoms consistent with distal embolization No changes in claudication distance or new rest pain symptoms.   There have been no interval changes in his overall healthcare since his last visit.   Patient denies amaurosis fugax or TIA symptoms.  The patient denies recent episodes of angina or shortness of breath.   Duplex US of the aorta and iliac arteries shows a 3.3 AAA sac with no endoleak, decrease in the sac compared to the previous study.    Review of Systems  All other systems reviewed and are negative.      Objective:   Physical Exam Vitals reviewed.  HENT:     Head: Normocephalic.  Cardiovascular:     Rate and Rhythm: Regular rhythm. Bradycardia present.     Pulses:          Radial pulses are 2+ on the right side and 2+ on the left side.     Heart sounds: Murmur heard.  Pulmonary:     Effort: Pulmonary effort is normal.  Skin:    General: Skin is warm and dry.  Neurological:     Mental Status: He is alert and oriented to person, place, and time.     BP (!) 143/55 (BP Location: Right Arm)   Pulse (!) 37   Resp 18   Ht 5\' 3"  (1.6 m)   Wt 160 lb 3.2 oz (72.7 kg)   BMI 28.38 kg/m   Past Medical History:  Diagnosis Date   AAA (abdominal aortic aneurysm) (HCC)    Hx of basal cell carcinoma 08/29/2017   R infrascapular back, R distal lat. bicep near elbow   Hyperlipidemia    Hypertension     Social History   Socioeconomic History   Marital status: Married    Spouse name: Not on file   Number of children: Not on file   Years of education: Not  on file   Highest education level: Not on file  Occupational History   Not on file  Tobacco Use   Smoking status: Never   Smokeless tobacco: Never  Vaping Use   Vaping status: Never Used  Substance and Sexual Activity   Alcohol use: No   Drug use: No   Sexual activity: Not on file  Other Topics Concern   Not on file  Social History Narrative   Lives at home with wife in private residence   Social Determinants of Health   Financial Resource Strain: Not on file  Food Insecurity: Not on file  Transportation Needs: Not on file  Physical Activity: Not on file  Stress: Not on file  Social Connections: Not on file  Intimate Partner Violence: Not on file    Past Surgical History:  Procedure Laterality Date   ABDOMINAL AORTIC ENDOVASCULAR STENT GRAFT N/A 06/25/2019   Procedure: ABDOMINAL AORTIC ENDOVASCULAR STENT GRAFT;  Surgeon: Annice Needy, MD;  Location: [facility];  Service: Vascular;  Laterality: N/A;   CARDIAC SURGERY     CATARACT EXTRACTION, BILATERAL     CHOLECYSTECTOMY  COLONOSCOPY     CORONARY ARTERY BYPASS GRAFT     ENDOVASCULAR REPAIR/STENT GRAFT N/A 06/25/2019   Procedure: ENDOVASCULAR REPAIR/STENT GRAFT;  Surgeon: Annice Needy, MD;  Location: [facility];  Service: Cardiovascular;  Laterality: N/A;   HERNIA REPAIR      Family History  Problem Relation Age of Onset   Heart disease Mother    Heart disease Father     Allergies  Allergen Reactions   Other     Other reaction(s): Unknown Tetanus: chest pain    Penicillins Hives   Tetanus Antitoxin    Tetanus Toxoids Other (See Comments)    Chest pain       Latest Ref Rng & Units 06/26/2019    4:23 AM 06/23/2019   12:29 PM 11/26/2015   10:40 AM  CBC  WBC 4.0 - 10.5 K/uL 12.3  7.3  9.2   Hemoglobin 13.0 - 17.0 g/dL 16.1  09.6  04.5   Hematocrit 39.0 - 52.0 % 38.8  41.3  42.6   Platelets 150 - 400 K/uL 154  149  151       CMP     Component Value Date/Time   NA 137 06/26/2019 0423   K 3.9  06/26/2019 0423   CL 108 06/26/2019 0423   CO2 22 06/26/2019 0423   GLUCOSE 149 (H) 06/26/2019 0423   BUN 14 06/26/2019 0423   CREATININE 0.81 06/26/2019 0423   CALCIUM 8.5 (L) 06/26/2019 0423   PROT 7.5 11/26/2015 1040   ALBUMIN 4.3 11/26/2015 1040   AST 22 11/26/2015 1040   ALT 19 11/26/2015 1040   ALKPHOS 57 11/26/2015 1040   BILITOT 0.9 11/26/2015 1040   GFRNONAA >60 06/26/2019 0423     No results found.     Assessment & Plan:   1. Infrarenal abdominal aortic aneurysm (AAA) without rupture (HCC) Recommend:  Patient is status post successful endovascular repair of the AAA.   No further intervention is required at this time.   No endoleak is detected and the aneurysm sac is stable.  The patient will continue antiplatelet therapy as prescribed as well as aggressive management of hyperlipidemia. Exercise is again strongly encouraged.   However, endografts require continued surveillance with ultrasound or CT scan. This is mandatory to detect any changes that allow repressurization of the aneurysm sac.  The patient is informed that this would be asymptomatic.  The patient is reminded that lifelong routine surveillance is a necessity with an endograft. Patient will continue to follow-up at the specified interval with ultrasound of the aorta.  2. Benign essential hypertension Continue antihypertensive medications as already ordered, these medications have been reviewed and there are no changes at this time.  3. Mixed hyperlipidemia Continue statin as ordered and reviewed, no changes at this time   Current Outpatient Medications on File Prior to Visit  Medication Sig Dispense Refill   amLODipine (NORVASC) 5 MG tablet Take 1 tablet by mouth daily.     atorvastatin (LIPITOR) 40 MG tablet Take 40 mg by mouth daily.      betamethasone dipropionate 0.05 % cream Apply topically 2 (two) times daily. Apply to affected areas of shoulders 45 g 0   betamethasone dipropionate 0.05 %  cream Apply to affected itchy skin qd up to 5 days a week prn, Avoid applying to face, groin, and axilla. Use as directed. Long-term use can cause thinning of the skin. 45 g 3   clopidogrel (PLAVIX) 75 MG tablet TAKE 1 TABLET  BY MOUTH EVERY DAY AT 6AM 90 tablet 3   oxybutynin (DITROPAN-XL) 10 MG 24 hr tablet Take 10 mg by mouth daily.     ramipril (ALTACE) 2.5 MG capsule Take 5 mg by mouth daily.     senna-docusate (SENOKOT-S) 8.6-50 MG tablet Take 1 tablet by mouth 2 (two) times daily.     No current facility-administered medications on file prior to visit.    There are no Patient Instructions on file for this visit. No follow-ups on file.   Georgiana Spinner, NP

## 2023-02-23 ENCOUNTER — Ambulatory Visit
Admission: RE | Admit: 2023-02-23 | Discharge: 2023-02-23 | Disposition: A | Discharge status: Home / Self Care | Payer: Medicare Other | Source: Ambulatory Visit | Attending: Internal Medicine | Admitting: Internal Medicine

## 2023-02-23 ENCOUNTER — Other Ambulatory Visit: Payer: Self-pay | Admitting: Internal Medicine

## 2023-02-23 DIAGNOSIS — I10 Essential (primary) hypertension: Secondary | ICD-10-CM | POA: Diagnosis present

## 2023-02-23 DIAGNOSIS — R2689 Other abnormalities of gait and mobility: Secondary | ICD-10-CM | POA: Insufficient documentation

## 2023-05-08 ENCOUNTER — Encounter
Admission: RE | Disposition: A | Discharge status: Home / Self Care | Payer: Self-pay | Source: Home / Self Care | Attending: Cardiology

## 2023-05-08 ENCOUNTER — Other Ambulatory Visit: Payer: Self-pay

## 2023-05-08 ENCOUNTER — Ambulatory Visit
Admission: RE | Admit: 2023-05-08 | Discharge: 2023-05-08 | Disposition: A | Discharge status: Home / Self Care | Payer: Medicare Other | Attending: Cardiology | Admitting: Cardiology

## 2023-05-08 DIAGNOSIS — I495 Sick sinus syndrome: Secondary | ICD-10-CM | POA: Diagnosis present

## 2023-05-08 HISTORY — PX: PACEMAKER IMPLANT: EP1218

## 2023-05-08 SURGERY — PACEMAKER IMPLANT
Anesthesia: Moderate Sedation

## 2023-05-08 MED ORDER — SENNOSIDES-DOCUSATE SODIUM 8.6-50 MG PO TABS
1.0000 | ORAL_TABLET | Freq: Two times a day (BID) | ORAL | Status: DC
  Filled 2023-05-08: qty 1

## 2023-05-08 MED ORDER — ONDANSETRON HCL 4 MG/2ML IJ SOLN: 4.0000 mg | Freq: Four times a day (QID) | INTRAMUSCULAR | Status: DC | PRN

## 2023-05-08 MED ORDER — CEFAZOLIN SODIUM-DEXTROSE 1-4 GM/50ML-% IV SOLN
1.0000 g | Freq: Four times a day (QID) | INTRAVENOUS | Status: DC
  Administered 2023-05-08: 1 g via INTRAVENOUS

## 2023-05-08 MED ORDER — FENTANYL CITRATE (PF) 100 MCG/2ML IJ SOLN
INTRAMUSCULAR | Status: DC | PRN
  Administered 2023-05-08: 25 ug via INTRAVENOUS

## 2023-05-08 MED ORDER — FENTANYL CITRATE (PF) 100 MCG/2ML IJ SOLN
INTRAMUSCULAR | Status: AC
  Filled 2023-05-08: qty 2

## 2023-05-08 MED ORDER — SODIUM CHLORIDE 0.9 % IV SOLN
INTRAVENOUS | Status: DC | PRN
  Administered 2023-05-08: 80 mg

## 2023-05-08 MED ORDER — MECLIZINE HCL 25 MG PO TABS: 25.0000 mg | ORAL_TABLET | Freq: Three times a day (TID) | ORAL | Status: DC | PRN

## 2023-05-08 MED ORDER — CEFAZOLIN SODIUM-DEXTROSE 2-4 GM/100ML-% IV SOLN
INTRAVENOUS | Status: AC
  Filled 2023-05-08: qty 100

## 2023-05-08 MED ORDER — CEPHALEXIN 500 MG PO CAPS: 500.0000 mg | ORAL_CAPSULE | Freq: Two times a day (BID) | ORAL | 0 refills | Status: AC

## 2023-05-08 MED ORDER — AMLODIPINE BESYLATE 5 MG PO TABS
5.0000 mg | ORAL_TABLET | Freq: Every day | ORAL | Status: DC
  Administered 2023-05-08: 5 mg via ORAL

## 2023-05-08 MED ORDER — CHLORHEXIDINE GLUCONATE CLOTH 2 % EX PADS
6.0000 | MEDICATED_PAD | Freq: Every day | CUTANEOUS | Status: DC
  Administered 2023-05-08: 6 via TOPICAL

## 2023-05-08 MED ORDER — ACETAMINOPHEN 325 MG PO TABS: 325.0000 mg | ORAL_TABLET | ORAL | Status: DC | PRN

## 2023-05-08 MED ORDER — MIDAZOLAM HCL 2 MG/2ML IJ SOLN
INTRAMUSCULAR | Status: DC | PRN
  Administered 2023-05-08: 1 mg via INTRAVENOUS

## 2023-05-08 MED ORDER — LIDOCAINE HCL 1 % IJ SOLN
INTRAMUSCULAR | Status: AC
  Filled 2023-05-08: qty 40

## 2023-05-08 MED ORDER — CEFAZOLIN SODIUM-DEXTROSE 1-4 GM/50ML-% IV SOLN
INTRAVENOUS | Status: AC
  Filled 2023-05-08: qty 50

## 2023-05-08 MED ORDER — SODIUM CHLORIDE 0.9 % IV SOLN
80.0000 mg | INTRAVENOUS | Status: DC
  Filled 2023-05-08: qty 2

## 2023-05-08 MED ORDER — MIDAZOLAM HCL 2 MG/2ML IJ SOLN
INTRAMUSCULAR | Status: AC
  Filled 2023-05-08: qty 2

## 2023-05-08 MED ORDER — RAMIPRIL 5 MG PO CAPS
5.0000 mg | ORAL_CAPSULE | Freq: Every day | ORAL | Status: DC
Start: 2023-05-08 — End: 2023-05-08
  Filled 2023-05-08: qty 1

## 2023-05-08 MED ORDER — HEPARIN (PORCINE) IN NACL 1000-0.9 UT/500ML-% IV SOLN
INTRAVENOUS | Status: AC
Start: 2023-05-08 — End: ?
  Filled 2023-05-08: qty 1000

## 2023-05-08 MED ORDER — AMLODIPINE BESYLATE 5 MG PO TABS
ORAL_TABLET | ORAL | Status: AC
  Filled 2023-05-08: qty 1

## 2023-05-08 MED ORDER — ATORVASTATIN CALCIUM 20 MG PO TABS
40.0000 mg | ORAL_TABLET | Freq: Every day | ORAL | Status: DC
  Administered 2023-05-08: 40 mg via ORAL
  Filled 2023-05-08: qty 2

## 2023-05-08 MED ORDER — SODIUM CHLORIDE 0.9 % IV SOLN: INTRAVENOUS | Status: DC

## 2023-05-08 MED ORDER — IOHEXOL 300 MG/ML  SOLN
INTRAMUSCULAR | Status: DC | PRN
  Administered 2023-05-08: 20 mL

## 2023-05-08 MED ORDER — CEFAZOLIN SODIUM-DEXTROSE 2-4 GM/100ML-% IV SOLN
2.0000 g | INTRAVENOUS | Status: AC
  Administered 2023-05-08: 2 g via INTRAVENOUS

## 2023-05-08 MED ORDER — OXYBUTYNIN CHLORIDE ER 10 MG PO TB24
10.0000 mg | ORAL_TABLET | Freq: Every day | ORAL | Status: DC
  Filled 2023-05-08: qty 1

## 2023-05-08 SURGICAL SUPPLY — 13 items
CABLE SURG 12 DISP A/V CHANNEL (MISCELLANEOUS) IMPLANT
DEVICE DSSCT PLSMBLD 3.0S LGHT (MISCELLANEOUS) IMPLANT
DRAPE INCISE 23X17 STRL (DRAPES) IMPLANT
DRAPE INCISE IOBAN 23X17 STRL (DRAPES) ×1
IPG PACE AZUR XT DR MRI W1DR01 (Pacemaker) IMPLANT
LEAD CAPSURE NOVUS 45CM (Lead) IMPLANT
LEAD CAPSURE NOVUS 5076-58CM (Lead) IMPLANT
PACE AZURE XT DR MRI W1DR01 (Pacemaker) ×1 IMPLANT
PAD ELECT DEFIB RADIOL ZOLL (MISCELLANEOUS) IMPLANT
PLASMABLADE 3.0S W/LIGHT (MISCELLANEOUS) ×1
SHEATH 7FR PRELUDE SNAP 13 (SHEATH) IMPLANT
SHEATH 9FR PRELUDE SNAP 13 (SHEATH) IMPLANT
TRAY PACEMAKER INSERTION (PACKS) ×1 IMPLANT

## 2023-05-08 NOTE — Discharge Instructions (Signed)
 For 6 weeks, avoid lifting greater than 15 pounds or raising your left arm above your head. Please do not shower until tomorrow and do not submerge your left chest in water (no baths, swimming) for at least 1 week or until you follow up with Alan Mam, PA. You can remove the clear bandage on your left chest if it starts to peel off, but please do NOT take off the steri strips underneath (thin rectangular strips). Take all your medicines as prescribed, including the antibiotic I have prescribed called Keflex  (cefalexin). Please call Dr. Ammon 's office at [facility] [phone]) or if you have any questions or concerns.

## 2023-05-09 ENCOUNTER — Encounter: Payer: Self-pay | Admitting: Cardiology

## 2023-07-03 IMAGING — MR MR CERVICAL SPINE W/O CM
5 series · 38 of 48 positions shown · non-contrast
Comparison: None.

CLINICAL DATA: Cervical radiculopathy Q8R.7O (IYN-C2-CM)

EXAM:
MRI CERVICAL SPINE WITHOUT CONTRAST
TECHNIQUE: Multiplanar, multisequence MR imaging of the cervical spine was
performed. No intravenous contrast was administered.

[Series 5: T2 · sagittal · 3.0mm · 0.62mm/px · 6 of 15 slices shown (1 of 2)]
[im 1/15]
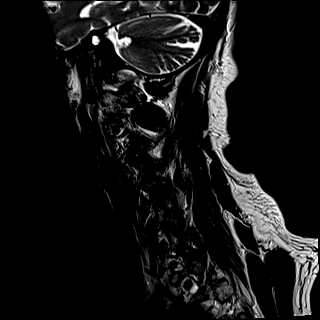
[im 3/15]
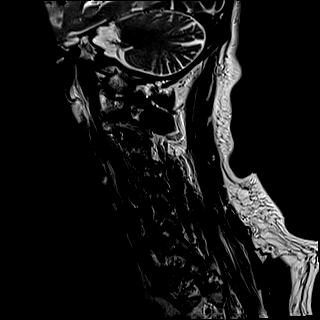
[im 6/15]
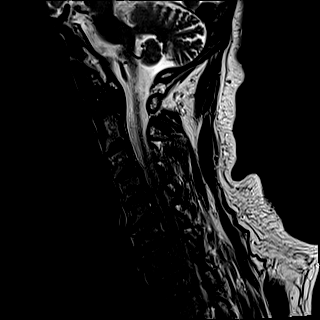
[im 9/15]
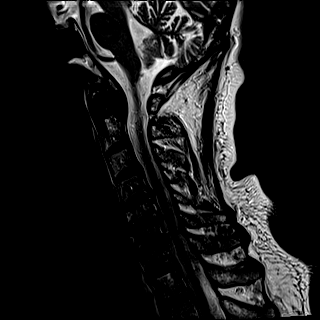
[im 12/15]
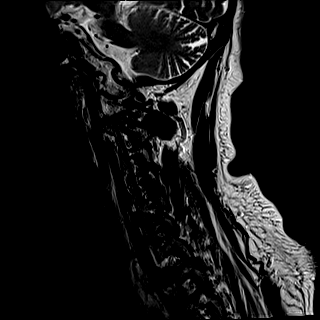
[im 15/15]
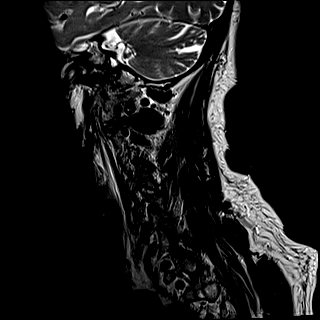

[Series 6: FLAIR · sagittal · 3.0mm · 0.78mm/px · 7 of 15 slices shown]
[im 1/15]
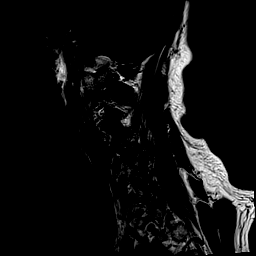
[im 3/15]
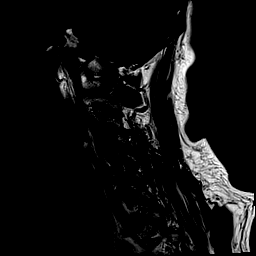
[im 5/15]
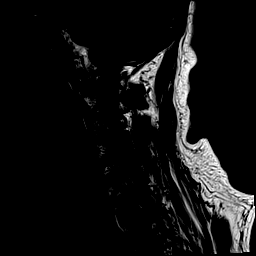
[im 8/15]
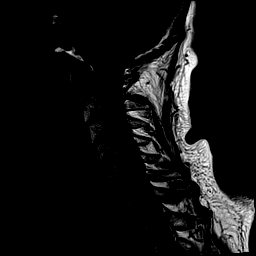
[im 10/15]
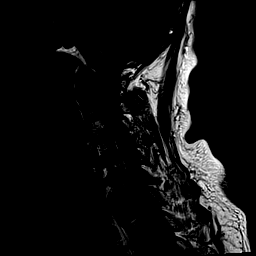
[im 12/15]
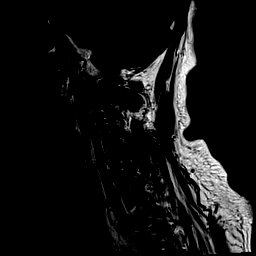
[im 15/15]
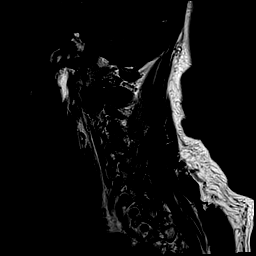

[Series 7: STIR · sagittal · 3.0mm · 0.62mm/px · 7 of 15 slices shown]
[im 1/15]
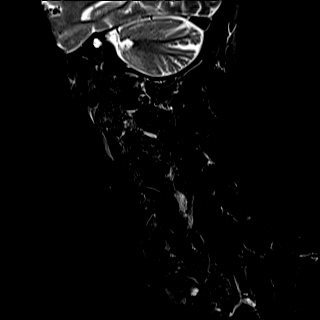
[im 3/15]
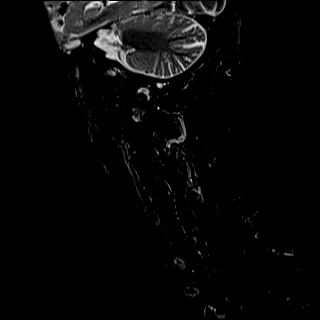
[im 5/15]
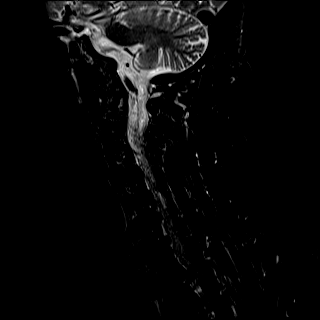
[im 8/15]
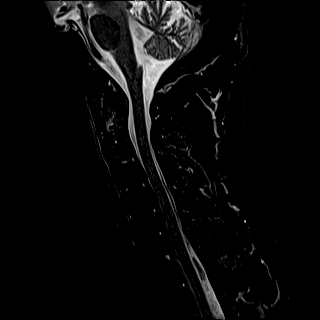
[im 10/15]
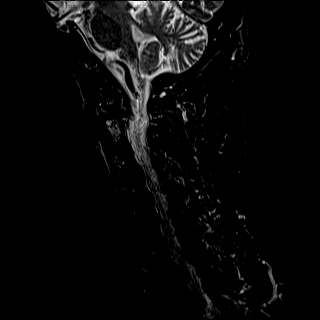
[im 12/15]
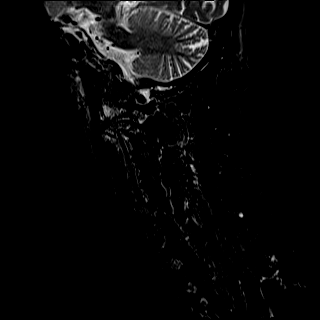
[im 15/15]
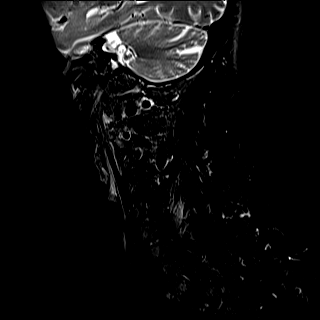

[Series 8: T2 · axial · 3.0mm · 0.70mm/px · z∈[-23,+72]mm · 10 of 29 slices shown (2 of 2)]
[im 1/29]
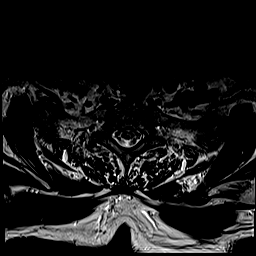
[im 3/29]
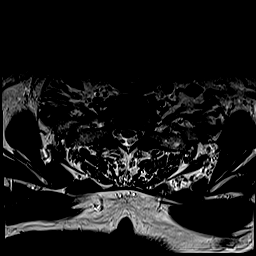
[im 5/29]
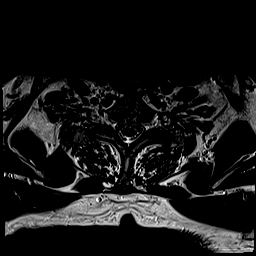
[im 7/29]
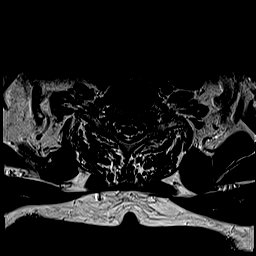
[im 9/29]
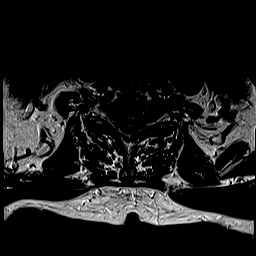
[im 13/29]
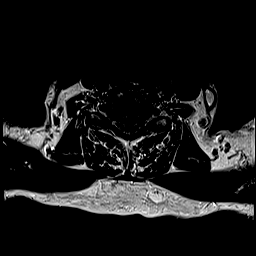
[im 16/29]
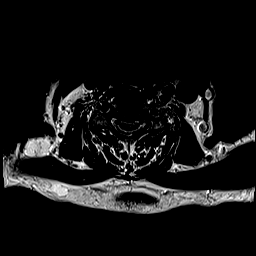
[im 20/29]
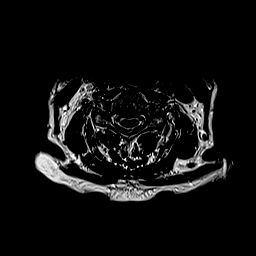
[im 24/29]
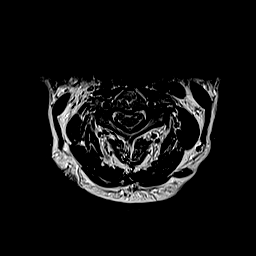
[im 29/29]
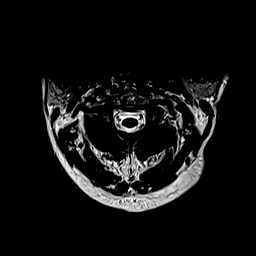

[Series 9: ax mpgr · axial · 3.0mm · 0.35mm/px · z∈[-23,+72]mm · 8 of 29 slices shown]
[im 1/29]
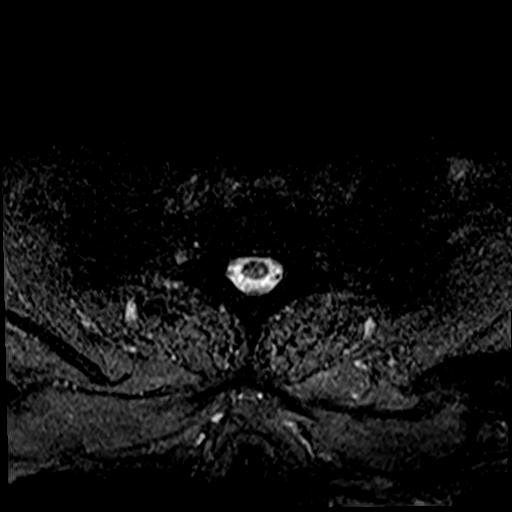
[im 5/29]
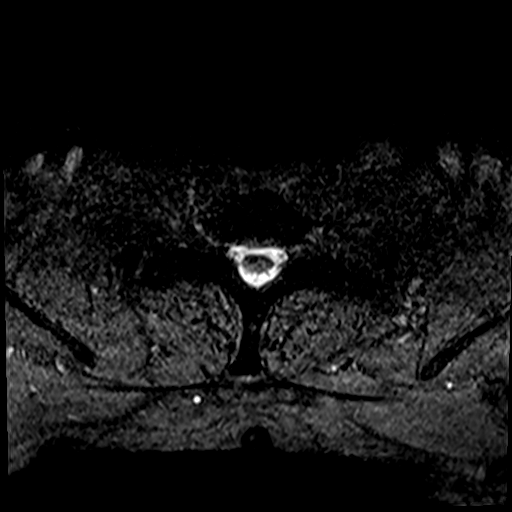
[im 9/29]
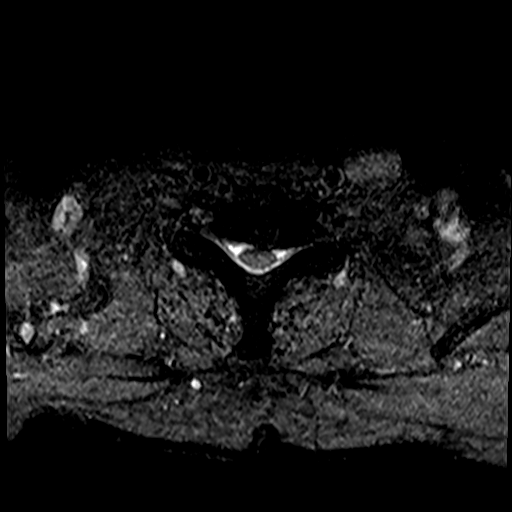
[im 13/29]
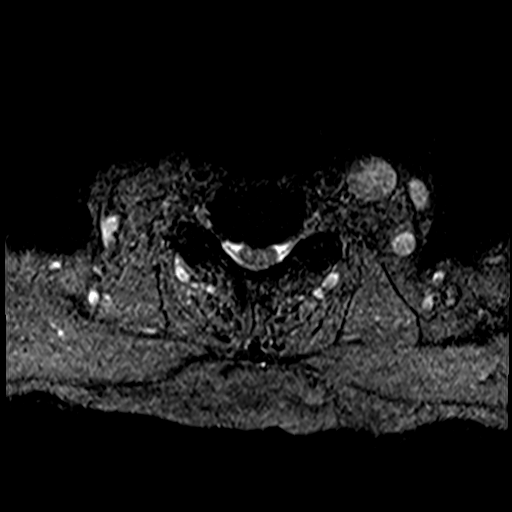
[im 16/29]
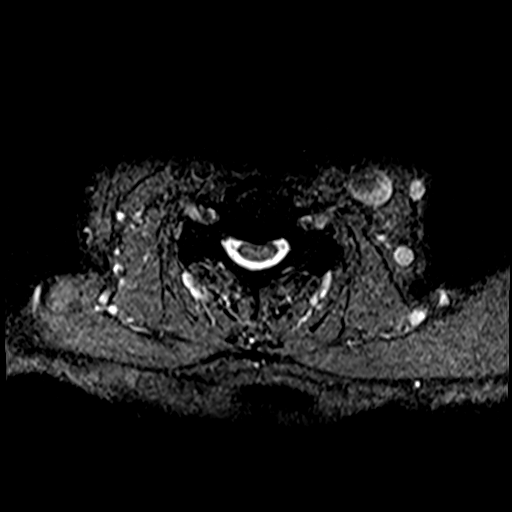
[im 20/29]
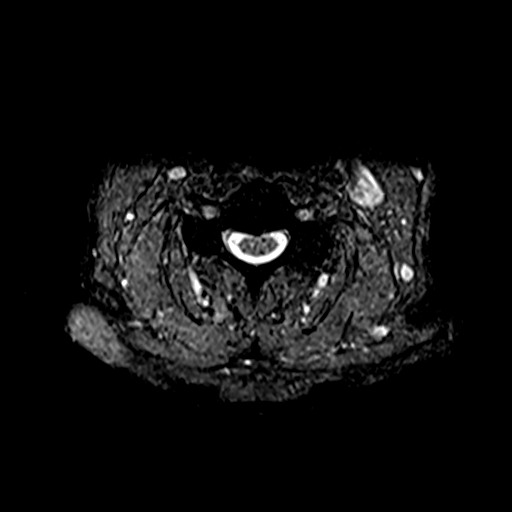
[im 24/29]
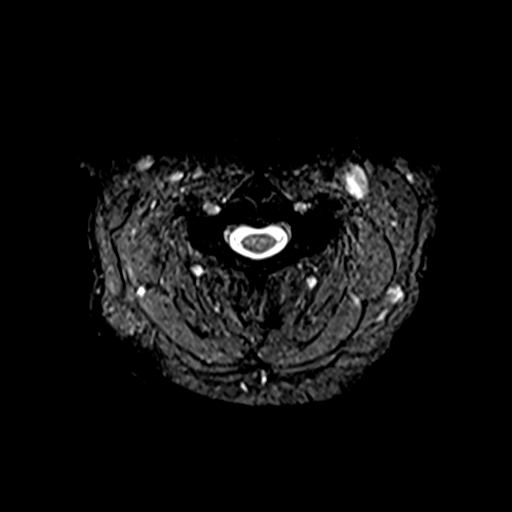
[im 29/29]
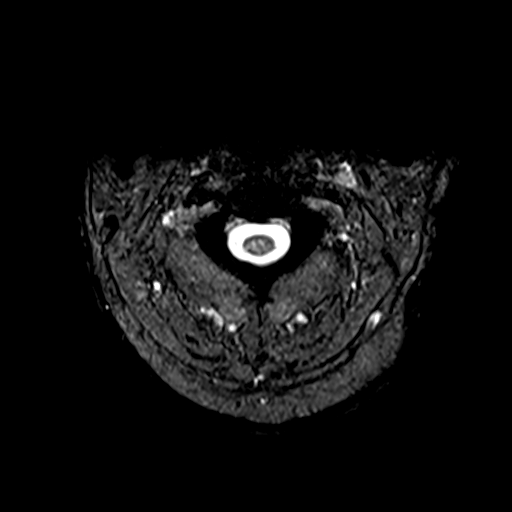

[38 of 48 positions shown; findings below may reference images not displayed]

FINDINGS: Alignment: Mild reversal the normal cervical lordosis. Slight
stepwise anterolisthesis of C3 on C4 and C4 on C5.

Vertebrae: Edema within the right eccentric dens and right C1/C2
lateral masses, likely degenerative.

Cord: Normal cord signal.

Posterior Fossa, vertebral arteries, paraspinal tissues: Visualized
vertebral artery flow voids are maintained. Unremarkable visualized
posterior fossa on limited sagittal assessment.

Disc levels:

C2-C3: Mild bilateral facet arthropathy and uncovertebral
hypertrophy. Mild left foraminal stenosis without significant canal
or right foraminal stenosis.

C3-C4: Slight anterolisthesis. Posterior disc osteophyte complex.
Left greater than right facet and uncovertebral hypertrophy.
Ligamentum flavum thickening. Resulting moderate bilateral foraminal
stenosis without significant canal stenosis.

C4-C5: Slight anterolisthesis. Posterior disc osteophyte complex.
Bilateral facet and uncovertebral hypertrophy. Ligamentum flavum
thickening. Resulting mild bilateral foraminal stenosis stenosis and
mild canal stenosis. Disc flattens the ventral cord.

C5-C6: Posterior disc osteophyte complex with right greater than
left uncovertebral hypertrophy. Ligamentum flavum thickening.
Resulting mild to moderate canal stenosis and moderate right and
mild left foraminal stenosis.

C6-C7: Posterior disc osteophyte complex with bilateral
uncovertebral hypertrophy. Resulting mild canal stenosis and
moderate right and mild left foraminal stenosis.

C7-T1: No significant disc protrusion, foraminal stenosis, or canal
stenosis.
IMPRESSION: 1. Moderate foraminal stenosis bilaterally at C3-C4, on the left at
C6-C7, and on the right at C5-C6. Multilevel mild foraminal
stenosis, as above.
2. Mild to moderate canal stenosis at C5-C6. Mild canal stenosis at
C4-C5 and C6-C7.
3. Edema within the right eccentric dens and right C1/C2 lateral
masses, likely degenerative.

## 2023-07-15 ENCOUNTER — Other Ambulatory Visit (INDEPENDENT_AMBULATORY_CARE_PROVIDER_SITE_OTHER): Payer: Self-pay | Admitting: Vascular Surgery

## 2023-09-06 ENCOUNTER — Ambulatory Visit: Payer: Medicare Other | Admitting: Dermatology

## 2023-09-18 ENCOUNTER — Encounter (INDEPENDENT_AMBULATORY_CARE_PROVIDER_SITE_OTHER): Payer: Self-pay

## 2023-10-15 ENCOUNTER — Encounter: Payer: Self-pay | Admitting: Dermatology

## 2023-10-15 ENCOUNTER — Ambulatory Visit (INDEPENDENT_AMBULATORY_CARE_PROVIDER_SITE_OTHER): Admitting: Dermatology

## 2023-10-15 DIAGNOSIS — S20412A Abrasion of left back wall of thorax, initial encounter: Secondary | ICD-10-CM

## 2023-10-15 DIAGNOSIS — Z79899 Other long term (current) drug therapy: Secondary | ICD-10-CM

## 2023-10-15 DIAGNOSIS — L280 Lichen simplex chronicus: Secondary | ICD-10-CM

## 2023-10-15 DIAGNOSIS — Z7189 Other specified counseling: Secondary | ICD-10-CM

## 2023-10-15 DIAGNOSIS — S40211A Abrasion of right shoulder, initial encounter: Secondary | ICD-10-CM

## 2023-10-15 DIAGNOSIS — T07XXXA Unspecified multiple injuries, initial encounter: Secondary | ICD-10-CM

## 2023-10-15 DIAGNOSIS — Z1283 Encounter for screening for malignant neoplasm of skin: Secondary | ICD-10-CM

## 2023-10-15 DIAGNOSIS — Z85828 Personal history of other malignant neoplasm of skin: Secondary | ICD-10-CM

## 2023-10-15 DIAGNOSIS — S40212A Abrasion of left shoulder, initial encounter: Secondary | ICD-10-CM

## 2023-10-15 DIAGNOSIS — L578 Other skin changes due to chronic exposure to nonionizing radiation: Secondary | ICD-10-CM

## 2023-10-15 DIAGNOSIS — L814 Other melanin hyperpigmentation: Secondary | ICD-10-CM

## 2023-10-15 DIAGNOSIS — D1801 Hemangioma of skin and subcutaneous tissue: Secondary | ICD-10-CM

## 2023-10-15 DIAGNOSIS — L905 Scar conditions and fibrosis of skin: Secondary | ICD-10-CM

## 2023-10-15 DIAGNOSIS — L281 Prurigo nodularis: Secondary | ICD-10-CM

## 2023-10-15 DIAGNOSIS — W908XXA Exposure to other nonionizing radiation, initial encounter: Secondary | ICD-10-CM

## 2023-10-15 DIAGNOSIS — D229 Melanocytic nevi, unspecified: Secondary | ICD-10-CM

## 2023-10-15 DIAGNOSIS — L821 Other seborrheic keratosis: Secondary | ICD-10-CM

## 2023-10-15 MED ORDER — MUPIROCIN 2 % EX OINT: TOPICAL_OINTMENT | CUTANEOUS | 0 refills | Status: AC

## 2023-10-15 NOTE — Progress Notes (Signed)
   Follow-Up Visit   Subjective  Derrick Jackson is a 89 y.o. male who presents for the following: Skin Cancer Screening and Full Body Skin Exam, hx of BCC  The patient presents for Total-Body Skin Exam (TBSE) for skin cancer screening and mole check. The patient has spots, moles and lesions to be evaluated, some may be new or changing and the patient may have concern these could be cancer.  The following portions of the chart were reviewed this encounter and updated as appropriate: medications, allergies, medical history  Review of Systems:  No other skin or systemic complaints except as noted in HPI or Assessment and Plan.  Objective  Well appearing patient in no apparent distress; mood and affect are within normal limits.  A full examination was performed including scalp, head, eyes, ears, nose, lips, neck, chest, axillae, abdomen, back, buttocks, bilateral upper extremities, bilateral lower extremities, hands, feet, fingers, toes, fingernails, and toenails. All findings within normal limits unless otherwise noted below.   Relevant physical exam findings are noted in the Assessment and Plan.   Assessment & Plan   SKIN CANCER SCREENING PERFORMED TODAY.  ACTINIC DAMAGE - Chronic condition, secondary to cumulative UV/sun exposure - diffuse scaly erythematous macules with underlying dyspigmentation - Recommend daily broad spectrum sunscreen SPF 30+ to sun-exposed areas, reapply every 2 hours as needed.  - Staying in the shade or wearing long sleeves, sun glasses (UVA+UVB protection) and wide brim hats (4-inch brim around the entire circumference of the hat) are also recommended for sun protection.  - Call for new or changing lesions.  LENTIGINES, SEBORRHEIC KERATOSES, HEMANGIOMAS - Benign normal skin lesions - Benign-appearing - Call for any changes  MELANOCYTIC NEVI - Tan-brown and/or pink-flesh-colored symmetric macules and papules - Benign appearing on exam today -  Observation - Call clinic for new or changing moles - Recommend daily use of broad spectrum spf 30+ sunscreen to sun-exposed areas.   LSC/Prurigo Nodularis   With 2 crusts / excoriations And hypopigmented scarring of shoulders. Shoulders and left upper back erythematous patches, and plaques of thickened leathery skin Start Mupirocin ointment apply to affected skin qd-bid   HISTORY OF BASAL CELL CARCINOMA OF THE SKIN Right infrascapular back 2019 - No evidence of recurrence today - Recommend regular full body skin exams - Recommend daily broad spectrum sunscreen SPF 30+ to sun-exposed areas, reapply every 2 hours as needed.  - Call if any new or changing lesions are noted between office visits  SKIN CANCER SCREENING   HISTORY OF BASAL CELL CARCINOMA   ACTINIC SKIN DAMAGE   LENTIGO   MELANOCYTIC NEVUS, UNSPECIFIED LOCATION   Return in about 1 year (around 10/14/2024) for TBSE, hx of BCC.  IClara Crisp, CMA, am acting as scribe for Celine Collard, MD .   Documentation: I have reviewed the above documentation for accuracy and completeness, and I agree with the above.  Celine Collard, MD

## 2023-10-15 NOTE — Patient Instructions (Signed)

## 2024-01-18 ENCOUNTER — Ambulatory Visit (INDEPENDENT_AMBULATORY_CARE_PROVIDER_SITE_OTHER): Payer: Medicare Other | Admitting: Vascular Surgery

## 2024-01-18 ENCOUNTER — Other Ambulatory Visit (INDEPENDENT_AMBULATORY_CARE_PROVIDER_SITE_OTHER): Payer: Medicare Other

## 2024-02-01 ENCOUNTER — Other Ambulatory Visit (INDEPENDENT_AMBULATORY_CARE_PROVIDER_SITE_OTHER): Payer: Self-pay | Admitting: Nurse Practitioner

## 2024-02-01 DIAGNOSIS — I714 Abdominal aortic aneurysm, without rupture, unspecified: Secondary | ICD-10-CM

## 2024-02-05 ENCOUNTER — Encounter (INDEPENDENT_AMBULATORY_CARE_PROVIDER_SITE_OTHER): Payer: Self-pay | Admitting: Vascular Surgery

## 2024-02-05 ENCOUNTER — Ambulatory Visit (INDEPENDENT_AMBULATORY_CARE_PROVIDER_SITE_OTHER)

## 2024-02-05 ENCOUNTER — Ambulatory Visit (INDEPENDENT_AMBULATORY_CARE_PROVIDER_SITE_OTHER): Admitting: Vascular Surgery

## 2024-02-05 VITALS — BP 170/68 | HR 80 | Ht 63.0 in | Wt 168.0 lb

## 2024-02-05 DIAGNOSIS — I10 Essential (primary) hypertension: Secondary | ICD-10-CM

## 2024-02-05 DIAGNOSIS — I7143 Infrarenal abdominal aortic aneurysm, without rupture: Secondary | ICD-10-CM

## 2024-02-05 DIAGNOSIS — I714 Abdominal aortic aneurysm, without rupture, unspecified: Secondary | ICD-10-CM | POA: Diagnosis not present

## 2024-02-05 DIAGNOSIS — E782 Mixed hyperlipidemia: Secondary | ICD-10-CM | POA: Diagnosis not present

## 2024-02-05 NOTE — Progress Notes (Signed)
 MRN : [id number]  Derrick Jackson is a 89 y.o. (Jan 02, 1932) male who presents with chief complaint of  Chief Complaint  Patient presents with   Follow-up    1 year folllow up with [facility]  .  History of Present Illness: Patient returns today in follow up of his abdominal aortic aneurysm.  He is about 4-1/2 years status post endovascular repair of his aneurysm.  He is doing well.  He does not have any aneurysm related symptoms or new complaints today. Specifically, the patient denies new back or abdominal pain, or signs of peripheral embolization.  Duplex shows a maximal diameter of the abdominal aortic aneurysm sac of 4.1 cm.  There is no endoleak.  Although this is larger than his most recent study, it is on par with his previous studies and not concerning.  Current Outpatient Medications  Medication Sig Dispense Refill   amLODipine  (NORVASC ) 5 MG tablet Take 5 mg by mouth daily.     atorvastatin  (LIPITOR) 40 MG tablet Take 40 mg by mouth daily.      cephALEXin  (KEFLEX ) 500 MG capsule Take 1 capsule (500 mg total) by mouth 2 (two) times daily. 20 capsule 0   clopidogrel  (PLAVIX ) 75 MG tablet TAKE 1 TABLET BY MOUTH EVERY DAY AT 6AM 90 tablet 3   meclizine  (ANTIVERT ) 25 MG tablet Take 25 mg by mouth 3 (three) times daily.     mupirocin  ointment (BACTROBAN ) 2 % Apply to affected skin qd-bid 22 g 0   oxybutynin  (DITROPAN -XL) 10 MG 24 hr tablet Take 10 mg by mouth daily.     ramipril  (ALTACE ) 5 MG capsule Take 5 mg by mouth daily.     No current facility-administered medications for this visit.    Past Medical History:  Diagnosis Date   AAA (abdominal aortic aneurysm)    Hx of basal cell carcinoma 08/29/2017   R infrascapular back, R distal lat. bicep near elbow   Hyperlipidemia    Hypertension     Past Surgical History:  Procedure Laterality Date   ABDOMINAL AORTIC ENDOVASCULAR STENT GRAFT N/A 06/25/2019   Procedure: ABDOMINAL AORTIC ENDOVASCULAR STENT GRAFT;  Surgeon: Marea Selinda RAMAN, MD;  Location: [facility];  Service: Vascular;  Laterality: N/A;   CARDIAC SURGERY     CATARACT EXTRACTION, BILATERAL     CHOLECYSTECTOMY     COLONOSCOPY     CORONARY ARTERY BYPASS GRAFT     ENDOVASCULAR STENT GRAFT (AAA) N/A 06/25/2019   Procedure: ENDOVASCULAR REPAIR/STENT GRAFT;  Surgeon: Marea Selinda RAMAN, MD;  Location: [facility];  Service: Cardiovascular;  Laterality: N/A;   HERNIA REPAIR     PACEMAKER IMPLANT N/A 05/08/2023   Procedure: PACEMAKER IMPLANT;  Surgeon: Ammon Blunt, MD;  Location: [facility];  Service: Cardiovascular;  Laterality: N/A;     Social History   Tobacco Use   Smoking status: Never   Smokeless tobacco: Never  Vaping Use   Vaping status: Never Used  Substance Use Topics   Alcohol use: No   Drug use: No       Family History  Problem Relation Age of Onset   Heart disease Mother    Heart disease Father      Allergies  Allergen Reactions   Penicillins Hives   Tetanus Antitoxin     Chest Pain    Tetanus Toxoid-Containing Vaccines Other (See Comments)    Chest pain     REVIEW OF SYSTEMS (Negative unless checked)  Constitutional: []   Weight loss  [] Fever  [] Chills Cardiac: [] Chest pain   [] Chest pressure   [] Palpitations   [] Shortness of breath when laying flat   [] Shortness of breath at rest   [] Shortness of breath with exertion. Vascular:  [] Pain in legs with walking   [] Pain in legs at rest   [] Pain in legs when laying flat   [] Claudication   [] Pain in feet when walking  [] Pain in feet at rest  [] Pain in feet when laying flat   [] History of DVT   [] Phlebitis   [] Swelling in legs   [] Varicose veins   [] Non-healing ulcers Pulmonary:   [] Uses home oxygen   [] Productive cough   [] Hemoptysis   [] Wheeze  [] COPD   [] Asthma Neurologic:  [] Dizziness  [] Blackouts   [] Seizures   [] History of stroke   [] History of TIA  [] Aphasia   [] Temporary blindness   [] Dysphagia   [] Weakness or numbness in arms   [] Weakness or numbness in  legs Musculoskeletal:  [x] Arthritis   [] Joint swelling   [x] Joint pain   [] Low back pain Hematologic:  [] Easy bruising  [] Easy bleeding   [] Hypercoagulable state   [] Anemic   Gastrointestinal:  [] Blood in stool   [] Vomiting blood  [] Gastroesophageal reflux/heartburn   [] Abdominal pain Genitourinary:  [] Chronic kidney disease   [] Difficult urination  [] Frequent urination  [] Burning with urination   [] Hematuria Skin:  [] Rashes   [] Ulcers   [] Wounds Psychological:  [] History of anxiety   []  History of major depression.  Physical Examination  BP (!) 170/68   Pulse 80   Ht 5' 3 (1.6 m)   Wt 168 lb (76.2 kg)   BMI 29.76 kg/m  Gen:  WD/WN, NAD. Appears younger than stated age. Head: [place]/AT, No temporalis wasting. Ear/Nose/Throat: Hearing grossly intact, nares w/o erythema or drainage Eyes: Conjunctiva clear. Sclera non-icteric Neck: Supple.  Trachea midline Pulmonary:  Good air movement, no use of accessory muscles.  Cardiac: RRR, no JVD Vascular:  Vessel Right Left  Radial Palpable Palpable                                   Gastrointestinal: soft, non-tender/non-distended. No guarding/reflex.  Musculoskeletal: M/S 5/5 throughout.  No deformity or atrophy. No edema. Neurologic: Sensation grossly intact in extremities.  Symmetrical.  Speech is fluent.  Psychiatric: Judgment intact, Mood & affect appropriate for pt's clinical situation. Dermatologic: No rashes or ulcers noted.  No cellulitis or open wounds.      Labs No results found for this or any previous visit (from the past 2160 hours).  Radiology No results found.  Assessment/Plan  AAA (abdominal aortic aneurysm) without rupture Duplex shows a maximal diameter of the abdominal aortic aneurysm sac of 4.1 cm.  There is no endoleak.  Although this is larger than his most recent study, it is on par with his previous studies and not concerning.  He is doing well.  No change to his medical regimen.  Continue to follow on  an annual basis with duplex.  Benign essential hypertension blood pressure control important in reducing the progression of atherosclerotic disease and aneurysmal growth. On appropriate oral medications.     Mixed hyperlipidemia lipid control important in reducing the progression of atherosclerotic disease. Continue statin therapy  Selinda Gu, MD  02/05/2024 9:19 AM    This note was created with Dragon medical transcription system.  Any errors from dictation are purely unintentional

## 2024-02-05 NOTE — Assessment & Plan Note (Signed)
 Duplex shows a maximal diameter of the abdominal aortic aneurysm sac of 4.1 cm.  There is no endoleak.  Although this is larger than his most recent study, it is on par with his previous studies and not concerning.  He is doing well.  No change to his medical regimen.  Continue to follow on an annual basis with duplex.

## 2024-10-14 ENCOUNTER — Ambulatory Visit: Admitting: Dermatology

## 2025-02-03 ENCOUNTER — Ambulatory Visit (INDEPENDENT_AMBULATORY_CARE_PROVIDER_SITE_OTHER): Admitting: Vascular Surgery

## 2025-02-03 ENCOUNTER — Other Ambulatory Visit (INDEPENDENT_AMBULATORY_CARE_PROVIDER_SITE_OTHER)
